# Patient Record
Sex: Male | Born: 2000 | Race: White | Hispanic: No | Marital: Single | Smoking: Never smoker
Health system: Southern US, Community
[De-identification: ages and names within clinical notes are randomized; demographics above are authoritative.]

## PROBLEM LIST (undated history)

## (undated) ENCOUNTER — Ambulatory Visit (HOSPITAL_COMMUNITY): Admission: EM | Payer: Medicaid Other

## (undated) DIAGNOSIS — M25571 Pain in right ankle and joints of right foot: Secondary | ICD-10-CM

## (undated) DIAGNOSIS — F329 Major depressive disorder, single episode, unspecified: Secondary | ICD-10-CM

## (undated) DIAGNOSIS — F32A Depression, unspecified: Secondary | ICD-10-CM

## (undated) HISTORY — PX: CAUTERIZE INNER NOSE: SHX279

---
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
## 2020-01-25 DIAGNOSIS — S92142D Displaced dome fracture of left talus, subsequent encounter for fracture with routine healing: Secondary | ICD-10-CM | POA: Diagnosis not present

## 2020-01-25 DIAGNOSIS — S92124D Nondisplaced fracture of body of right talus, subsequent encounter for fracture with routine healing: Secondary | ICD-10-CM | POA: Diagnosis not present

## 2020-01-25 DIAGNOSIS — S92014D Nondisplaced fracture of body of right calcaneus, subsequent encounter for fracture with routine healing: Secondary | ICD-10-CM | POA: Diagnosis not present

## 2020-02-03 DIAGNOSIS — S99911A Unspecified injury of right ankle, initial encounter: Secondary | ICD-10-CM | POA: Diagnosis not present

## 2020-02-03 DIAGNOSIS — M25571 Pain in right ankle and joints of right foot: Secondary | ICD-10-CM | POA: Diagnosis not present

## 2020-02-12 DIAGNOSIS — S93401D Sprain of unspecified ligament of right ankle, subsequent encounter: Secondary | ICD-10-CM | POA: Diagnosis not present

## 2020-02-12 DIAGNOSIS — M899 Disorder of bone, unspecified: Secondary | ICD-10-CM | POA: Diagnosis not present

## 2020-02-12 DIAGNOSIS — S92124D Nondisplaced fracture of body of right talus, subsequent encounter for fracture with routine healing: Secondary | ICD-10-CM | POA: Diagnosis not present

## 2020-02-12 DIAGNOSIS — S92014D Nondisplaced fracture of body of right calcaneus, subsequent encounter for fracture with routine healing: Secondary | ICD-10-CM | POA: Diagnosis not present

## 2020-02-12 DIAGNOSIS — M949 Disorder of cartilage, unspecified: Secondary | ICD-10-CM | POA: Diagnosis not present

## 2020-02-29 ENCOUNTER — Ambulatory Visit: Payer: Self-pay

## 2020-02-29 ENCOUNTER — Other Ambulatory Visit: Payer: Self-pay

## 2020-02-29 ENCOUNTER — Encounter: Payer: Self-pay | Admitting: Podiatry

## 2020-02-29 ENCOUNTER — Ambulatory Visit (INDEPENDENT_AMBULATORY_CARE_PROVIDER_SITE_OTHER): Payer: Medicaid Other | Admitting: Podiatry

## 2020-02-29 DIAGNOSIS — M899 Disorder of bone, unspecified: Secondary | ICD-10-CM

## 2020-02-29 DIAGNOSIS — S92901A Unspecified fracture of right foot, initial encounter for closed fracture: Secondary | ICD-10-CM

## 2020-02-29 DIAGNOSIS — S92014A Nondisplaced fracture of body of right calcaneus, initial encounter for closed fracture: Secondary | ICD-10-CM

## 2020-02-29 DIAGNOSIS — M25371 Other instability, right ankle: Secondary | ICD-10-CM

## 2020-02-29 DIAGNOSIS — S93401A Sprain of unspecified ligament of right ankle, initial encounter: Secondary | ICD-10-CM
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
## 2020-03-02 DIAGNOSIS — Z419 Encounter for procedure for purposes other than remedying health state, unspecified: Secondary | ICD-10-CM | POA: Diagnosis not present

## 2020-03-08 ENCOUNTER — Other Ambulatory Visit: Payer: Self-pay

## 2020-03-08 DIAGNOSIS — S93401A Sprain of unspecified ligament of right ankle, initial encounter: Secondary | ICD-10-CM

## 2020-03-09 ENCOUNTER — Other Ambulatory Visit: Payer: Self-pay | Admitting: Podiatry

## 2020-03-09 DIAGNOSIS — M25371 Other instability, right ankle: Secondary | ICD-10-CM

## 2020-03-09 DIAGNOSIS — S92014A Nondisplaced fracture of body of right calcaneus, initial encounter for closed fracture: Secondary | ICD-10-CM

## 2020-03-09 DIAGNOSIS — S92901A Unspecified fracture of right foot, initial encounter for closed fracture: Secondary | ICD-10-CM

## 2020-03-09 DIAGNOSIS — M899 Disorder of bone, unspecified: Secondary | ICD-10-CM

## 2020-03-09 DIAGNOSIS — S93401A Sprain of unspecified ligament of right ankle, initial encounter: Secondary | ICD-10-CM

## 2020-03-09 NOTE — Progress Notes (Signed)
Received fax from [facility] that his insurance is out of network, referral sent to [facility] on [address]  Sharl Ma, [place] 03/09/2020

## 2020-03-14 DIAGNOSIS — Z20828 Contact with and (suspected) exposure to other viral communicable diseases: Secondary | ICD-10-CM | POA: Diagnosis not present

## 2020-03-14 DIAGNOSIS — J309 Allergic rhinitis, unspecified: Secondary | ICD-10-CM | POA: Diagnosis not present

## 2020-03-18 ENCOUNTER — Ambulatory Visit: Payer: Medicaid Other | Attending: Podiatry | Admitting: Physical Therapy

## 2020-03-18 ENCOUNTER — Other Ambulatory Visit: Payer: Self-pay

## 2020-03-18 ENCOUNTER — Encounter: Payer: Self-pay | Admitting: Physical Therapy

## 2020-03-18 ENCOUNTER — Ambulatory Visit
Admission: RE | Admit: 2020-03-18 | Discharge: 2020-03-18 | Disposition: A | Discharge status: Home / Self Care | Payer: Medicaid Other | Source: Ambulatory Visit | Attending: Podiatry | Admitting: Podiatry

## 2020-03-18 DIAGNOSIS — M899 Disorder of bone, unspecified: Secondary | ICD-10-CM | POA: Diagnosis not present

## 2020-03-18 DIAGNOSIS — M949 Disorder of cartilage, unspecified: Secondary | ICD-10-CM | POA: Diagnosis not present

## 2020-03-18 DIAGNOSIS — R262 Difficulty in walking, not elsewhere classified: Secondary | ICD-10-CM | POA: Diagnosis not present

## 2020-03-18 DIAGNOSIS — M25471 Effusion, right ankle: Secondary | ICD-10-CM | POA: Diagnosis not present

## 2020-03-18 DIAGNOSIS — M25371 Other instability, right ankle: Secondary | ICD-10-CM | POA: Insufficient documentation

## 2020-03-18 DIAGNOSIS — R600 Localized edema: Secondary | ICD-10-CM | POA: Insufficient documentation

## 2020-03-18 DIAGNOSIS — S93401A Sprain of unspecified ligament of right ankle, initial encounter: Secondary | ICD-10-CM

## 2020-03-18 DIAGNOSIS — S93401D Sprain of unspecified ligament of right ankle, subsequent encounter: Secondary | ICD-10-CM | POA: Diagnosis not present

## 2020-03-18 DIAGNOSIS — M25571 Pain in right ankle and joints of right foot: Secondary | ICD-10-CM | POA: Insufficient documentation

## 2020-03-18 DIAGNOSIS — M19071 Primary osteoarthritis, right ankle and foot: Secondary | ICD-10-CM | POA: Diagnosis not present

## 2020-03-18 DIAGNOSIS — S92901D Unspecified fracture of right foot, subsequent encounter for fracture with routine healing: Secondary | ICD-10-CM | POA: Insufficient documentation

## 2020-03-18 DIAGNOSIS — S92014D Nondisplaced fracture of body of right calcaneus, subsequent encounter for fracture with routine healing: Secondary | ICD-10-CM | POA: Insufficient documentation
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
## 2020-03-30 ENCOUNTER — Other Ambulatory Visit: Payer: Self-pay

## 2020-03-30 ENCOUNTER — Ambulatory Visit: Payer: Medicaid Other | Admitting: Physical Therapy

## 2020-03-30 ENCOUNTER — Encounter: Payer: Self-pay | Admitting: Physical Therapy

## 2020-03-30 DIAGNOSIS — S92014D Nondisplaced fracture of body of right calcaneus, subsequent encounter for fracture with routine healing: Secondary | ICD-10-CM | POA: Diagnosis not present

## 2020-03-30 DIAGNOSIS — M25371 Other instability, right ankle: Secondary | ICD-10-CM

## 2020-03-30 DIAGNOSIS — M899 Disorder of bone, unspecified: Secondary | ICD-10-CM

## 2020-03-30 DIAGNOSIS — M25571 Pain in right ankle and joints of right foot: Secondary | ICD-10-CM

## 2020-03-30 DIAGNOSIS — S92901D Unspecified fracture of right foot, subsequent encounter for fracture with routine healing: Secondary | ICD-10-CM

## 2020-03-30 DIAGNOSIS — S93401D Sprain of unspecified ligament of right ankle, subsequent encounter: Secondary | ICD-10-CM | POA: Diagnosis not present

## 2020-03-30 DIAGNOSIS — R262 Difficulty in walking, not elsewhere classified: Secondary | ICD-10-CM

## 2020-03-30 DIAGNOSIS — R600 Localized edema: Secondary | ICD-10-CM

## 2020-03-30 DIAGNOSIS — M949 Disorder of cartilage, unspecified: Secondary | ICD-10-CM | POA: Diagnosis not present

## 2020-03-30 NOTE — Therapy (Signed)
[facility] Outpatient Rehabilitation [facility] [address] [place], [place], [address] Phone: [phone]   Fax:  [phone]  Physical Therapy Treatment  Patient Details  Name: Christopher Mccann MRN: [id number] Date of Birth: May 24, 2001 Referring Provider (PT): Sharl Ma, DPM   Encounter Date: 03/30/2020   PT End of Session - 03/30/20 0808    Visit Number 2    Number of Visits 12    Date for PT Re-Evaluation 04/29/20    Authorization Type [employer] - pending Berkley Harvey    PT Start Time 0807   pt arrived late   PT Stop Time 0845    PT Time Calculation (min) 38 min    Activity Tolerance Patient tolerated treatment well    Behavior During Therapy [facility] for tasks assessed/performed           Past Medical History:  Diagnosis Date  . Depression     History reviewed. No pertinent surgical history.  There were no vitals filed for this visit.   Subjective Assessment - 03/30/20 0810    Subjective " no changes since the last session. I do the exercises once a day mostly at night. I see the MD tomorrow to discuss the CT results."    Diagnostic tests CT scan 03/19/2020 IMPRESSION:1. No acute osseous injury of the right ankle.2. 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 No ossific intra-articular loose body.3. Small ankle joint effusion. Small subtalar joint effusion.4. Healed nondisplaced fracture of the sustentacular talus. Smallbony fragments along the course of the ATFL likely reflectingsequela prior avulsive injury.    Currently in Pain? Yes    Pain Score 1     Pain Orientation Right              [facility] PT Assessment - 03/30/20 0001      Assessment   Medical Diagnosis Right ankle sprain and talar avulsion fracture, right calcaneal fracture, right talar dome lesion, ankle instability    Referring Provider (PT) Sharl Ma, DPM                          [facility] Adult PT Treatment/Exercise - 03/30/20 0001      Knee/Hip Exercises: Standing   Gait Training heel strike/ toe off forward/ backward in // x 5 ea. Then 4 x 50 ft forward only    verbal cues and demonstration for proper form     Ankle Exercises: Stretches   Slant Board Stretch 30 seconds;4 reps   slant board 2 x gatroc 2 x soleus      Ankle Exercises: Aerobic   Recumbent Bike L3 x 5 min       Ankle Exercises: Seated   BAPS Level 2;Sitting   DF/ PF x20, inversion/ everions x 20, CW/CCW circles 20 ea.   Other Seated Ankle Exercises 4- way ankle strengthening 1 x 15 ea way with green theraband      Ankle Exercises: Standing   Heel Raises 15 reps;Both   x 2 sets with               Balance Exercises - 03/30/20 0001      Balance Exercises: Standing   Standing Eyes Opened Narrow base of support (BOS);Solid surface;1 rep;30 secs    Standing Eyes Closed Narrow base of support (BOS);Solid surface;2 reps;30 secs    Tandem Stance Eyes open;Eyes closed;4 reps;30 secs  PT Education - 03/30/20 0865    Education Details reviewed HEP and discussed importance of consistency with his HEP. updated HEP for theraband ankle 4-way strengthening.    Person(s) Educated Patient    Methods Explanation;Verbal cues    Comprehension Verbalized understanding;Verbal cues required            PT Short Term Goals - 03/18/20 1423      PT SHORT TERM GOAL #1   Title Independent with initial HEP    Baseline needs HEP    Time 3    Period Weeks    Status New    Target Date 04/08/20      PT SHORT TERM GOAL #2   Title Increase right ankle inversion strength to 4+/5 to help improve ankle stability for gait/outdoor ambulation over uneven surfaces    Baseline 4/5    Time 3    Period Weeks    Status New    Target Date 04/08/20             PT Long Term Goals - 03/18/20 1424      PT LONG TERM GOAL #1   Title Tolerate standing and walking for  periods at least 3-4 hours at a time for work shifts with right ankle pain 3/10 or less    Baseline pain up to 8/10    Time 6    Period Weeks    Status New    Target Date 04/29/20      PT LONG TERM GOAL #2   Title Right ankle strength 5/5 to improve ankle stability for gait and outdoor ambulation over uneven surfaces    Baseline see objective-grossly 4 to 4+/5    Time 6    Period Weeks    Status New    Target Date 04/29/20      PT LONG TERM GOAL #3   Title Demonstrate Romberg on Airex eyes closed as well as bilat. tandem stance x 20 sec ea. 1st attempt for improved balance and proprioception for improved safety with ambulation    Baseline see objective, required >1 attempt    Time 6    Period Weeks    Status New    Target Date 04/29/20      PT LONG TERM GOAL #4   Title Increase right ankle PF AROM at least 20 deg to improve gait mechanics for toe off in terminal stance    Baseline 30 deg AROM    Time 6    Period Weeks    Status New    Target Date 04/29/20                 Plan - 03/30/20 0830    Clinical Impression Statement pt reports limited consistency with his HEP due to time constraints. reviewed importance of consistency with his HEP. continued ankle strengtheing and motor contorl which he did well toda. pt sees his MD tomorrow to review CT results. He did well with balance but continues to demo increased postural sway with tandem positiong. practiced gait training which verbal cues and demonstration were required to promote efficency. No changes in pain noted during or following session.    PT Next Visit Plan Review HEP as needed, pending pain and tolerance progress open and closed chain ankle strengthening/stabilization, balance and propriopceptive challenges, gait training.    PT Home Exercise Plan Access code: [email or web address] ankle DF and PF, ankle IV and EV isometrics, Romberg and tandem stance, ankle 4-way theraband strengthening.  Consulted and Agree with  Plan of Care Patient           Patient will benefit from skilled therapeutic intervention in order to improve the following deficits and impairments:  Pain, Decreased strength, Decreased activity tolerance, Increased edema, Decreased range of motion, Decreased balance, Difficulty walking, Hypomobility  Visit Diagnosis: Sprain of ligament of right ankle, subsequent encounter  Unstable right ankle  Closed nondisplaced fracture of body of right calcaneus with routine healing, subsequent encounter  Bone disease  Disorder of bone and cartilage  Closed fracture of right foot with routine healing, subsequent encounter  Pain in right ankle and joints of right foot  Localized edema  Difficulty in walking, not elsewhere classified     Problem List There are no problems to display for this patient.  Lulu Riding PT, DPT, LAT, ATC  03/30/20  8:47 AM      [facility] [address] [place], [place], [address] Phone: [phone]   Fax:  [phone]  Name: Christopher Mccann MRN: [id number] Date of Birth: [birth year]

## 2020-03-31 ENCOUNTER — Ambulatory Visit (INDEPENDENT_AMBULATORY_CARE_PROVIDER_SITE_OTHER): Payer: Medicaid Other | Admitting: Podiatry

## 2020-03-31 DIAGNOSIS — M216X1 Other acquired deformities of right foot: Secondary | ICD-10-CM | POA: Diagnosis not present

## 2020-03-31 DIAGNOSIS — M899 Disorder of bone, unspecified: Secondary | ICD-10-CM | POA: Diagnosis not present

## 2020-03-31 DIAGNOSIS — M949 Disorder of cartilage, unspecified: Secondary | ICD-10-CM | POA: Diagnosis not present

## 2020-03-31 NOTE — Patient Instructions (Signed)
Osteochondral Fracture  An osteochondral fracture is a break or tear (rupture) in the protective tissue that cushions bones in the joints (articular cartilage). Osteochondral fractures most commonly happen in the knees or ankles, but they can also happen in other parts of the body. What are the causes? This condition is caused by a forceful hit to your joint. It can be caused by one event or from repetitive damage to your joint over time. What increases the risk? You are more likely to develop this condition if you are:  A younger person. Children and adolescents are at greater risk of this condition.  An athlete. What are the signs or symptoms? Symptoms of this condition include:  Swelling.  Pain.  A crackling sound (crepitation) when you move the joint.  Difficulty moving the joint, or feeling like the joint catches or locks when you try to move it.  Instability of the joint. How is this diagnosed? This condition is diagnosed based on:  Your complete medical history.  A physical exam.  Imaging tests. These may include X-rays, a CT scan, or an MRI. How is this treated? This condition may be treated by:  Icing the injured area and taking medicines. These help with pain and inflammation.  Using crutches. These help with walking. They may be needed if the injured joint is in one of the legs.  Wearing a cast or splint. This keeps the cartilage and bones in place so they can heal.  Having surgery. This may be done if other treatments do not work. During the surgery, any bones that have broken off will either be put into place or be removed if they cannot be reattached.  Doing joint and muscle exercises. These help to strengthen and stretch the affected joints and muscles. They may be done at home or with a physical therapist. If treated properly, symptoms go away in most cases. Follow these instructions at home: Medicines  Take over-the-counter and prescription medicines as  told by your health care provider.  Ask your health care provider if the medicine prescribed to you: ? Requires you to avoid driving or using heavy machinery. ? Can cause constipation. You may need to take actions to prevent or treat constipation, such as:  Drink enough fluid to keep your urine pale yellow.  Take over-the-counter or prescription medicines.  Eat foods that are high in fiber, such as beans, whole grains, and fresh fruits and vegetables.  Limit foods that are high in fat and processed sugars, such as fried or sweet foods. If you have a cast:  Do not stick anything inside the cast to scratch your skin. Doing that increases your risk of infection.  You may put lotion on dry skin around the edges of the cast. Do not put lotion on the skin underneath the cast.  Check the skin around the cast every day. Tell your health care provider about any concerns.  Keep the cast clean and dry. If you have a splint:  Wear the splint as told by your health care provider. Remove it only as told by your health care provider.  Loosen the splint if your fingers or toes tingle, become numb, or turn cold and blue.  Keep the splint clean and dry. Bathing  Do not take baths, swim, or use a hot tub until your health care provider approves. Ask your health care provider if you may take showers. You may only be allowed to take sponge baths.  If your cast or splint  is not waterproof: ? Do not let it get wet. ? Cover it with a watertight covering when you take a bath or shower. Managing pain, stiffness, and swelling   If directed, put ice on the injured area. ? If you have a removable splint, remove it as told by your health care provider. ? Put ice in a plastic bag. ? Place a towel between your skin and the bag or between your cast and the bag. ? Leave the ice on for 20 minutes, 2-3 times a day.  Move your fingers or toes often to reduce stiffness and swelling.  Raise (elevate) the  injured area above the level of your heart while you are sitting or lying down. Activity  Do not lift anything that is heavier than 10 lb (4.5 kg), or the limit that you are told, until your health care provider says that it is safe.  Perform exercises daily as told by your health care provider or physical therapist.  Return to your normal activities as told by your health care provider. Ask your health care provider what activities are safe for you. General instructions  Do not put pressure on any part of the cast or splint until it is fully hardened. This may take several hours.  Ask your health care provider when it is safe to drive if you have a cast or splint on your arm or leg.  Do not use any products that contain nicotine or tobacco, such as cigarettes, e-cigarettes, and chewing tobacco. These can delay healing. If you need help quitting, ask your health care provider.  Keep all follow-up visits as told by your health care provider. This is important. Contact a health care provider if:  You have symptoms that get worse or do not improve after 2 weeks of treatment. Get help right away if:  You have severe pain. Summary  An osteochondral fracture is a break or tear in the protective tissue that cushions bones in the joints.  This condition is caused by a forceful hit to your joint. It can be caused by one event or from repetitive damage to your joint over time.  These fractures most commonly happen in the knees or ankles, but they can also happen in other parts of the body.  This injury is treated with rest, ice, medicines, and surgery if needed.  Contact a health care provider if your symptoms get worse or do not improve after 2 weeks of treatment. This information is not intended to replace advice given to you by your health care provider. Make sure you discuss any questions you have with your health care provider. Document Revised: 10/09/2018 Document Reviewed:  05/26/2018 Elsevier Patient Education  2020 [employer]  
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
## 2020-04-01 ENCOUNTER — Ambulatory Visit: Payer: Medicaid Other | Admitting: Physical Therapy

## 2020-04-04 ENCOUNTER — Ambulatory Visit: Payer: Medicaid Other | Admitting: Physical Therapy

## 2020-04-06 ENCOUNTER — Other Ambulatory Visit: Payer: Self-pay

## 2020-04-06 ENCOUNTER — Encounter: Payer: Self-pay | Admitting: Physical Therapy

## 2020-04-06 ENCOUNTER — Ambulatory Visit: Payer: Medicaid Other | Attending: Podiatry | Admitting: Physical Therapy

## 2020-04-06 DIAGNOSIS — M899 Disorder of bone, unspecified: Secondary | ICD-10-CM | POA: Diagnosis not present

## 2020-04-06 DIAGNOSIS — S92901D Unspecified fracture of right foot, subsequent encounter for fracture with routine healing: Secondary | ICD-10-CM | POA: Insufficient documentation

## 2020-04-06 DIAGNOSIS — M25371 Other instability, right ankle: Secondary | ICD-10-CM | POA: Insufficient documentation

## 2020-04-06 DIAGNOSIS — M25571 Pain in right ankle and joints of right foot: Secondary | ICD-10-CM | POA: Insufficient documentation

## 2020-04-06 DIAGNOSIS — R262 Difficulty in walking, not elsewhere classified: Secondary | ICD-10-CM | POA: Diagnosis not present

## 2020-04-06 DIAGNOSIS — S93401D Sprain of unspecified ligament of right ankle, subsequent encounter: Secondary | ICD-10-CM | POA: Insufficient documentation

## 2020-04-06 DIAGNOSIS — R600 Localized edema: Secondary | ICD-10-CM | POA: Insufficient documentation

## 2020-04-06 DIAGNOSIS — M949 Disorder of cartilage, unspecified: Secondary | ICD-10-CM | POA: Insufficient documentation

## 2020-04-06 DIAGNOSIS — S92014D Nondisplaced fracture of body of right calcaneus, subsequent encounter for fracture with routine healing: Secondary | ICD-10-CM | POA: Diagnosis not present
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
## 2020-04-11 ENCOUNTER — Ambulatory Visit: Payer: Medicaid Other | Admitting: Physical Therapy

## 2020-04-11 ENCOUNTER — Other Ambulatory Visit: Payer: Self-pay

## 2020-04-11 ENCOUNTER — Encounter: Payer: Self-pay | Admitting: Physical Therapy

## 2020-04-11 DIAGNOSIS — M25571 Pain in right ankle and joints of right foot: Secondary | ICD-10-CM | POA: Diagnosis not present

## 2020-04-11 DIAGNOSIS — S92014D Nondisplaced fracture of body of right calcaneus, subsequent encounter for fracture with routine healing: Secondary | ICD-10-CM

## 2020-04-11 DIAGNOSIS — M25371 Other instability, right ankle: Secondary | ICD-10-CM | POA: Diagnosis not present

## 2020-04-11 DIAGNOSIS — S93401D Sprain of unspecified ligament of right ankle, subsequent encounter: Secondary | ICD-10-CM | POA: Diagnosis not present

## 2020-04-11 DIAGNOSIS — S92901D Unspecified fracture of right foot, subsequent encounter for fracture with routine healing: Secondary | ICD-10-CM

## 2020-04-11 DIAGNOSIS — M899 Disorder of bone, unspecified: Secondary | ICD-10-CM

## 2020-04-11 DIAGNOSIS — R262 Difficulty in walking, not elsewhere classified: Secondary | ICD-10-CM | POA: Diagnosis not present

## 2020-04-11 DIAGNOSIS — R600 Localized edema: Secondary | ICD-10-CM | POA: Diagnosis not present

## 2020-04-11 DIAGNOSIS — M949 Disorder of cartilage, unspecified: Secondary | ICD-10-CM | POA: Diagnosis not present

## 2020-04-11 NOTE — Therapy (Signed)
[facility] [address] [place], [place], [address] Phone: [phone]   Fax:  [phone]  Physical Therapy Treatment    Patient Details  Name: Christopher Mccann MRN: [id number] Date of Birth: 2000-09-10 Referring Provider (PT): Sharl Ma, DPM   Encounter Date: 04/11/2020   PT End of Session - 04/11/20 0848    Visit Number 4    Number of Visits 12    Date for PT Re-Evaluation 04/29/20    Authorization Type [employer] - pending auth    Authorization Time Period 03/30/2020 - 05/10/2020    Progress Note Due on Visit 12    PT Start Time 0849    PT Stop Time 0929    PT Time Calculation (min) 40 min    Activity Tolerance Patient tolerated treatment well    Behavior During Therapy [facility] for tasks assessed/performed           Past Medical History:  Diagnosis Date  . Depression     History reviewed. No pertinent surgical history.  There were no vitals filed for this visit.   Subjective Assessment - 04/11/20 0849    Subjective "I feel like I am getting better since I started here with PT."    Diagnostic tests CT scan 03/19/2020 IMPRESSION:1. No acute osseous injury of the right ankle.2. 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 No ossific intra-articular loose body.3. Small ankle joint effusion. Small subtalar joint effusion.4. Healed nondisplaced fracture of the sustentacular talus. Smallbony fragments along the course of the ATFL likely reflectingsequela prior avulsive injury.    Currently in Pain? Yes    Pain Score 2     Pain Orientation Right    Pain Descriptors / Indicators Aching;Sore    Pain Type Chronic pain    Pain Onset More than a month ago    Pain Frequency Intermittent    Aggravating Factors  just being on the ankle all day    Pain Relieving Factors rubbing the ankle              [facility] PT Assessment  - 04/11/20 0001      Assessment   Medical Diagnosis Right ankle sprain and talar avulsion fracture, right calcaneal fracture, right talar dome lesion, ankle instability    Referring Provider (PT) Sharl Ma, DPM                         [facility] Adult PT Treatment/Exercise - 04/11/20 0001      Knee/Hip Exercises: Standing   Step Down 2 sets;10 reps;Left;Step Height: 4"    Wall Squat 2 sets;10 reps      Manual Therapy   Manual Therapy Joint mobilization;Soft tissue mobilization    Manual therapy comments arch support tape R ankle    Joint Mobilization talocrural PA/AP grade IV, and talocrural distaction grad IV    Soft tissue mobilization MTPR for the tibialis posterior, and peroneal longs/ brevis      Ankle Exercises: Seated   BAPS Sitting;Level 3   DF/PF, inversion/ everions and CW/CCW 1 x 20 ea.   Other Seated Ankle Exercises 4- way ankle strengthening 1 x 20 ea way with Blue theraband      Ankle Exercises: Aerobic   Recumbent Bike L5 x 5 min       Ankle Exercises: Stretches   Slant Board Stretch 2 reps;30 seconds  gastroc/ soleus              Balance Exercises - 04/11/20 0001      Balance Exercises: Standing   Heel Raises Both;15 reps   on airex pad              PT Short Term Goals - 03/18/20 1423      PT SHORT TERM GOAL #1   Title Independent with initial HEP    Baseline needs HEP    Time 3    Period Weeks    Status New    Target Date 04/08/20      PT SHORT TERM GOAL #2   Title Increase right ankle inversion strength to 4+/5 to help improve ankle stability for gait/outdoor ambulation over uneven surfaces    Baseline 4/5    Time 3    Period Weeks    Status New    Target Date 04/08/20             PT Long Term Goals - 03/18/20 1424      PT LONG TERM GOAL #1   Title Tolerate standing and walking for periods at least 3-4 hours at a time for work shifts with right ankle pain 3/10 or less    Baseline pain up to 8/10    Time 6     Period Weeks    Status New    Target Date 04/29/20      PT LONG TERM GOAL #2   Title Right ankle strength 5/5 to improve ankle stability for gait and outdoor ambulation over uneven surfaces    Baseline see objective-grossly 4 to 4+/5    Time 6    Period Weeks    Status New    Target Date 04/29/20      PT LONG TERM GOAL #3   Title Demonstrate Romberg on Airex eyes closed as well as bilat. tandem stance x 20 sec ea. 1st attempt for improved balance and proprioception for improved safety with ambulation    Baseline see objective, required >1 attempt    Time 6    Period Weeks    Status New    Target Date 04/29/20      PT LONG TERM GOAL #4   Title Increase right ankle PF AROM at least 20 deg to improve gait mechanics for toe off in terminal stance    Baseline 30 deg AROM    Time 6    Period Weeks    Status New    Target Date 04/29/20                 Plan - 04/11/20 0916    Clinical Impression Statement pt reports improvement since starting PT and is motivated to continued with treatment and strengthenging. trialed arch support taping to promote arch positioning and conitnued ankle motor contorl and strengthening. He did well with CKC strengthening with wall squats and step up/ down noting soreness end of session rated at s 2-3/10.    PT Treatment/Interventions ADLs/Self Care Home Management;Cryotherapy;Electrical Stimulation;Moist Heat;Iontophoresis 4mg /ml Dexamethasone;Therapeutic activities;Functional mobility training;Gait training;Therapeutic exercise;Patient/family education;Manual techniques;Neuromuscular re-education;Balance training;Taping;Vasopneumatic Device    PT Next Visit Plan continue to work on strengthening and balance until he has his second opinion, update HEP for CKC strengthening, how was arch taping.    PT Home Exercise Plan Access code: [email or web address]-Theraband ankle DF and PF, ankle IV and EV isometrics, Romberg and tandem stance, ankle 4-way theraband  strengthening.  Patient will benefit from skilled therapeutic intervention in order to improve the following deficits and impairments:  Pain, Decreased strength, Decreased activity tolerance, Increased edema, Decreased range of motion, Decreased balance, Difficulty walking, Hypomobility  Visit Diagnosis: Sprain of ligament of right ankle, subsequent encounter  Unstable right ankle  Closed nondisplaced fracture of body of right calcaneus with routine healing, subsequent encounter  Bone disease  Disorder of bone and cartilage  Closed fracture of right foot with routine healing, subsequent encounter  Pain in right ankle and joints of right foot  Localized edema  Difficulty in walking, not elsewhere classified     Problem List There are no problems to display for this patient.  Lulu Riding PT, DPT, LAT, ATC  04/11/20  9:30 AM      [facility] [address] [place], [place], [address] Phone: [phone]   Fax:  [phone]  Name: Gaven Eugene MRN: [id number] Date of Birth: 30-Aug-2000

## 2020-04-13 ENCOUNTER — Other Ambulatory Visit: Payer: Self-pay

## 2020-04-13 ENCOUNTER — Encounter: Payer: Self-pay | Admitting: Physical Therapy

## 2020-04-13 ENCOUNTER — Ambulatory Visit: Payer: Medicaid Other | Admitting: Physical Therapy

## 2020-04-13 DIAGNOSIS — M25571 Pain in right ankle and joints of right foot: Secondary | ICD-10-CM

## 2020-04-13 DIAGNOSIS — M949 Disorder of cartilage, unspecified: Secondary | ICD-10-CM

## 2020-04-13 DIAGNOSIS — S93401D Sprain of unspecified ligament of right ankle, subsequent encounter: Secondary | ICD-10-CM | POA: Diagnosis not present

## 2020-04-13 DIAGNOSIS — R262 Difficulty in walking, not elsewhere classified: Secondary | ICD-10-CM | POA: Diagnosis not present

## 2020-04-13 DIAGNOSIS — M25371 Other instability, right ankle: Secondary | ICD-10-CM | POA: Diagnosis not present

## 2020-04-13 DIAGNOSIS — M899 Disorder of bone, unspecified: Secondary | ICD-10-CM | POA: Diagnosis not present

## 2020-04-13 DIAGNOSIS — R600 Localized edema: Secondary | ICD-10-CM

## 2020-04-13 DIAGNOSIS — S92014D Nondisplaced fracture of body of right calcaneus, subsequent encounter for fracture with routine healing: Secondary | ICD-10-CM

## 2020-04-13 DIAGNOSIS — S92901D Unspecified fracture of right foot, subsequent encounter for fracture with routine healing: Secondary | ICD-10-CM

## 2020-04-13 NOTE — Therapy (Signed)
[facility] Outpatient Rehabilitation [facility] [address] [place], [place], [address] Phone: [phone]   Fax:  [phone]  Physical Therapy Treatment  Patient Details  Name: Christopher Mccann MRN: [id number] Date of Birth: December 15, 2000 Referring Provider (PT): Sharl Ma, DPM   Encounter Date: 04/13/2020   PT End of Session - 04/13/20 0813    Visit Number 5    Number of Visits 12    Date for PT Re-Evaluation 04/29/20    Authorization Type [employer] - pending auth    Authorization Time Period 03/30/2020 - 05/10/2020    Authorization - Visit Number 3    Authorization - Number of Visits 12    PT Start Time [phone]   pt arrived late today   PT Stop Time 0844    PT Time Calculation (min) 30 min    Activity Tolerance Patient tolerated treatment well    Behavior During Therapy [facility] for tasks assessed/performed           Past Medical History:  Diagnosis Date  . Depression     History reviewed. No pertinent surgical history.  There were no vitals filed for this visit.   Subjective Assessment - 04/13/20 0815    Subjective "I am unsure if the tape helped or not, I did feel alittle more sore after the last session. today I am feeling about 3/10 pain in the ankle."    Diagnostic tests CT scan 03/19/2020 IMPRESSION:1. No acute osseous injury of the right ankle.2. 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 No ossific intra-articular loose body.3. Small ankle joint effusion. Small subtalar joint effusion.4. Healed nondisplaced fracture of the sustentacular talus. Smallbony fragments along the course of the ATFL likely reflectingsequela prior avulsive injury.    Patient Stated Goals Get ankle pain    Currently in Pain? Yes    Pain Score 3     Pain Location Ankle    Pain Orientation Right              [facility] PT Assessment - 04/13/20 0001      Assessment    Medical Diagnosis Right ankle sprain and talar avulsion fracture, right calcaneal fracture, right talar dome lesion, ankle instability    Referring Provider (PT) Sharl Ma, DPM                         [facility] Adult PT Treatment/Exercise - 04/13/20 0001      Knee/Hip Exercises: Standing   Hip Abduction Stengthening;Both;2 sets;15 reps;Knee straight   with red band   Hip Extension Stengthening;Both;2 sets;Knee straight;15 reps   with red band   Step Down 2 sets;10 reps;Left;Step Height: 4"    Gait Training heel strike/ toe off forward 6 x 50 ft   demnstration and verbal cues for proper form     Ankle Exercises: Aerobic   Recumbent Bike L5 x 5 min      Ankle Exercises: Stretches   Slant Board Stretch 2 reps;30 seconds      Ankle Exercises: Standing   Heel Raises 20 reps;Both   from slant board              Balance Exercises - 04/13/20 0001      Balance Exercises: Standing   Rebounder Double leg;Foam/compliant surface;20 reps   x 2 sets rhomberg pos, 2 x modified tandem with red ball  PT Short Term Goals - 03/18/20 1423      PT SHORT TERM GOAL #1   Title Independent with initial HEP    Baseline needs HEP    Time 3    Period Weeks    Status New    Target Date 04/08/20      PT SHORT TERM GOAL #2   Title Increase right ankle inversion strength to 4+/5 to help improve ankle stability for gait/outdoor ambulation over uneven surfaces    Baseline 4/5    Time 3    Period Weeks    Status New    Target Date 04/08/20             PT Long Term Goals - 03/18/20 1424      PT LONG TERM GOAL #1   Title Tolerate standing and walking for periods at least 3-4 hours at a time for work shifts with right ankle pain 3/10 or less    Baseline pain up to 8/10    Time 6    Period Weeks    Status New    Target Date 04/29/20      PT LONG TERM GOAL #2   Title Right ankle strength 5/5 to improve ankle stability for gait and outdoor ambulation over  uneven surfaces    Baseline see objective-grossly 4 to 4+/5    Time 6    Period Weeks    Status New    Target Date 04/29/20      PT LONG TERM GOAL #3   Title Demonstrate Romberg on Airex eyes closed as well as bilat. tandem stance x 20 sec ea. 1st attempt for improved balance and proprioception for improved safety with ambulation    Baseline see objective, required >1 attempt    Time 6    Period Weeks    Status New    Target Date 04/29/20      PT LONG TERM GOAL #4   Title Increase right ankle PF AROM at least 20 deg to improve gait mechanics for toe off in terminal stance    Baseline 30 deg AROM    Time 6    Period Weeks    Status New    Target Date 04/29/20                 Plan - 04/13/20 0817    Clinical Impression Statement pt arrived to today's session noting mild inrease in pain which he is unsure if it is related to the arch taping or new exercises. limited session due to pt arriving late so focusing on functional strengthening and balance training.    PT Treatment/Interventions ADLs/Self Care Home Management;Cryotherapy;Electrical Stimulation;Moist Heat;Iontophoresis 4mg /ml Dexamethasone;Therapeutic activities;Functional mobility training;Gait training;Therapeutic exercise;Patient/family education;Manual techniques;Neuromuscular re-education;Balance training;Taping;Vasopneumatic Device    PT Next Visit Plan continue to work on strengthening and balance until he has his second opinion, update HEP for CKC strengthening, hip/ knee strengthening.    PT Home Exercise Plan Access code: [email or web address] ankle DF and PF, ankle IV and EV isometrics, Romberg and tandem stance, ankle 4-way theraband strengthening.    Consulted and Agree with Plan of Care Patient           Patient will benefit from skilled therapeutic intervention in order to improve the following deficits and impairments:  Pain, Decreased strength, Decreased activity tolerance, Increased edema, Decreased  range of motion, Decreased balance, Difficulty walking, Hypomobility  Visit Diagnosis: Sprain of ligament of right ankle, subsequent encounter  Unstable right ankle  Closed  nondisplaced fracture of body of right calcaneus with routine healing, subsequent encounter  Bone disease  Disorder of bone and cartilage  Closed fracture of right foot with routine healing, subsequent encounter  Pain in right ankle and joints of right foot  Localized edema  Difficulty in walking, not elsewhere classified     Problem List There are no problems to display for this patient.  Lulu Riding PT, DPT, LAT, ATC  04/13/20  8:47 AM      [facility] [address] [place], [place], [address] Phone: [phone]   Fax:  [phone]  Name: Christopher Mccann MRN: [id number] Date of Birth: 11-Jul-2000

## 2020-04-18 ENCOUNTER — Encounter: Payer: Self-pay | Admitting: Physical Therapy

## 2020-04-18 ENCOUNTER — Ambulatory Visit: Payer: Medicaid Other | Admitting: Physical Therapy

## 2020-04-18 ENCOUNTER — Other Ambulatory Visit: Payer: Self-pay

## 2020-04-18 DIAGNOSIS — M949 Disorder of cartilage, unspecified: Secondary | ICD-10-CM

## 2020-04-18 DIAGNOSIS — R600 Localized edema: Secondary | ICD-10-CM | POA: Diagnosis not present

## 2020-04-18 DIAGNOSIS — S92014D Nondisplaced fracture of body of right calcaneus, subsequent encounter for fracture with routine healing: Secondary | ICD-10-CM

## 2020-04-18 DIAGNOSIS — S93401D Sprain of unspecified ligament of right ankle, subsequent encounter: Secondary | ICD-10-CM | POA: Diagnosis not present

## 2020-04-18 DIAGNOSIS — S92901D Unspecified fracture of right foot, subsequent encounter for fracture with routine healing: Secondary | ICD-10-CM

## 2020-04-18 DIAGNOSIS — R262 Difficulty in walking, not elsewhere classified: Secondary | ICD-10-CM

## 2020-04-18 DIAGNOSIS — M899 Disorder of bone, unspecified: Secondary | ICD-10-CM

## 2020-04-18 DIAGNOSIS — M25571 Pain in right ankle and joints of right foot: Secondary | ICD-10-CM | POA: Diagnosis not present

## 2020-04-18 DIAGNOSIS — M25371 Other instability, right ankle: Secondary | ICD-10-CM

## 2020-04-18 NOTE — Therapy (Signed)
[facility] Outpatient Rehabilitation [facility] [address] [place], [place], [address] Phone: [phone]   Fax:  [phone]  Physical Therapy Treatment  Patient Details  Name: Christopher Mccann MRN: [id number] Date of Birth: Nov 10, 2000 Referring Provider (PT): Sharl Ma, DPM   Encounter Date: 04/18/2020   PT End of Session - 04/18/20 0807    Visit Number 6    Number of Visits 12    Date for PT Re-Evaluation 04/29/20    Authorization Type [employer] - pending auth    Authorization Time Period 03/30/2020 - 05/10/2020    Authorization - Visit Number 4    Authorization - Number of Visits 12    PT Start Time 0808   pt arrived late today   PT Stop Time 0846    PT Time Calculation (min) 38 min           Past Medical History:  Diagnosis Date   Depression     History reviewed. No pertinent surgical history.  There were no vitals filed for this visit.   Subjective Assessment - 04/18/20 0809    Subjective "no issues today, I am still doing those exercise at home and I think it is helping."    Currently in Pain? No/denies    Aggravating Factors  unsure of specific activity              [facility] PT Assessment - 04/18/20 0001      Assessment   Medical Diagnosis Right ankle sprain and talar avulsion fracture, right calcaneal fracture, right talar dome lesion, ankle instability    Referring Provider (PT) Sharl Ma, DPM                         [facility] Adult PT Treatment/Exercise - 04/18/20 0001      Knee/Hip Exercises: Machines for Strengthening   Cybex Knee Extension 2 x 15 20#    Cybex Knee Flexion 2 x 15 25#      Knee/Hip Exercises: Standing   Forward Step Up 2 sets;15 reps   onto BOSU for dynamic balance training.     Ankle Exercises: Aerobic   Elliptical L1 x 5 min ramp L1      Ankle Exercises: Standing   Heel Raises 20 reps;Other (comment)   con bil, eccentric RLE only     Ankle Exercises: Stretches   Slant Board Stretch 4  reps;30 seconds   2 x gastroc / 2 x soleus              Balance Exercises - 04/18/20 0001      Balance Exercises: Standing   Tandem Stance Eyes open;2 reps   switching lead leg abc with black physioball   SLS with Vectors Solid surface   ABC with black phsioball requiring L LE support   Rebounder Single leg;Static;10 reps   3 x RLE requiring intermittent contralateral support   Marching --               PT Short Term Goals - 03/18/20 1423      PT SHORT TERM GOAL #1   Title Independent with initial HEP    Baseline needs HEP    Time 3    Period Weeks    Status New    Target Date 04/08/20      PT SHORT TERM GOAL #2   Title Increase right ankle inversion strength to 4+/5 to help improve ankle stability for gait/outdoor ambulation over uneven surfaces  Baseline 4/5    Time 3    Period Weeks    Status New    Target Date 04/08/20             PT Long Term Goals - 03/18/20 1424      PT LONG TERM GOAL #1   Title Tolerate standing and walking for periods at least 3-4 hours at a time for work shifts with right ankle pain 3/10 or less    Baseline pain up to 8/10    Time 6    Period Weeks    Status New    Target Date 04/29/20      PT LONG TERM GOAL #2   Title Right ankle strength 5/5 to improve ankle stability for gait and outdoor ambulation over uneven surfaces    Baseline see objective-grossly 4 to 4+/5    Time 6    Period Weeks    Status New    Target Date 04/29/20      PT LONG TERM GOAL #3   Title Demonstrate Romberg on Airex eyes closed as well as bilat. tandem stance x 20 sec ea. 1st attempt for improved balance and proprioception for improved safety with ambulation    Baseline see objective, required >1 attempt    Time 6    Period Weeks    Status New    Target Date 04/29/20      PT LONG TERM GOAL #4   Title Increase right ankle PF AROM at least 20 deg to improve gait mechanics for toe off in terminal stance    Baseline 30 deg AROM    Time 6     Period Weeks    Status New    Target Date 04/29/20                 Plan - 04/18/20 0845    Clinical Impression Statement Pt reports no pain today and continues to note benefit of strengthening with improvement in pain when doing his HEP at home. Continued working on hip/ knee strengthening to work up the kinetic chain and ankle strengthening as well as balance training which he demonstrated significant limitation with R SLS. pt reported no pain end of session.    PT Treatment/Interventions ADLs/Self Care Home Management;Cryotherapy;Electrical Stimulation;Moist Heat;Iontophoresis 4mg /ml Dexamethasone;Therapeutic activities;Functional mobility training;Gait training;Therapeutic exercise;Patient/family education;Manual techniques;Neuromuscular re-education;Balance training;Taping;Vasopneumatic Device    PT Next Visit Plan continue to work on strengthening and balance until he has his second opinion, update HEP for CKC strengthening, hip/ knee strengthening.    PT Home Exercise Plan Access code: [email or web address] ankle DF and PF, ankle IV and EV isometrics, Romberg and tandem stance, ankle 4-way theraband strengthening.    Consulted and Agree with Plan of Care Patient           Patient will benefit from skilled therapeutic intervention in order to improve the following deficits and impairments:  Pain, Decreased strength, Decreased activity tolerance, Increased edema, Decreased range of motion, Decreased balance, Difficulty walking, Hypomobility  Visit Diagnosis: Sprain of ligament of right ankle, subsequent encounter  Unstable right ankle  Closed nondisplaced fracture of body of right calcaneus with routine healing, subsequent encounter  Bone disease  Disorder of bone and cartilage  Difficulty in walking, not elsewhere classified  Pain in right ankle and joints of right foot  Localized edema  Closed fracture of right foot with routine healing, subsequent  encounter     Problem List There are no problems to display for this patient.  Lulu Riding PT, DPT, LAT, ATC  04/18/20  8:51 AM      [facility] [address] [place], [place], [address] Phone: [phone]   Fax:  [phone]  Name: Christopher Mccann MRN: [id number] Date of Birth: [birth year]

## 2020-04-20 ENCOUNTER — Ambulatory Visit (INDEPENDENT_AMBULATORY_CARE_PROVIDER_SITE_OTHER): Payer: Medicaid Other | Admitting: Podiatry

## 2020-04-20 ENCOUNTER — Encounter: Payer: Self-pay | Admitting: Podiatry

## 2020-04-20 ENCOUNTER — Other Ambulatory Visit: Payer: Self-pay

## 2020-04-20 ENCOUNTER — Ambulatory Visit: Payer: Medicaid Other | Admitting: Physical Therapy

## 2020-04-20 DIAGNOSIS — M899 Disorder of bone, unspecified: Secondary | ICD-10-CM | POA: Diagnosis not present

## 2020-04-20 DIAGNOSIS — S92014D Nondisplaced fracture of body of right calcaneus, subsequent encounter for fracture with routine healing: Secondary | ICD-10-CM | POA: Diagnosis not present

## 2020-04-20 DIAGNOSIS — R262 Difficulty in walking, not elsewhere classified: Secondary | ICD-10-CM

## 2020-04-20 DIAGNOSIS — M25571 Pain in right ankle and joints of right foot: Secondary | ICD-10-CM

## 2020-04-20 DIAGNOSIS — M25371 Other instability, right ankle: Secondary | ICD-10-CM

## 2020-04-20 DIAGNOSIS — S92901D Unspecified fracture of right foot, subsequent encounter for fracture with routine healing: Secondary | ICD-10-CM

## 2020-04-20 DIAGNOSIS — S93401D Sprain of unspecified ligament of right ankle, subsequent encounter: Secondary | ICD-10-CM

## 2020-04-20 DIAGNOSIS — S93401A Sprain of unspecified ligament of right ankle, initial encounter: Secondary | ICD-10-CM | POA: Diagnosis not present

## 2020-04-20 DIAGNOSIS — R600 Localized edema: Secondary | ICD-10-CM

## 2020-04-20 DIAGNOSIS — M949 Disorder of cartilage, unspecified: Secondary | ICD-10-CM
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
## 2020-04-25 ENCOUNTER — Ambulatory Visit: Payer: Medicaid Other | Admitting: Physical Therapy

## 2020-04-25 ENCOUNTER — Other Ambulatory Visit: Payer: Self-pay

## 2020-04-25 DIAGNOSIS — R600 Localized edema: Secondary | ICD-10-CM | POA: Diagnosis not present

## 2020-04-25 DIAGNOSIS — M25571 Pain in right ankle and joints of right foot: Secondary | ICD-10-CM

## 2020-04-25 DIAGNOSIS — M949 Disorder of cartilage, unspecified: Secondary | ICD-10-CM | POA: Diagnosis not present

## 2020-04-25 DIAGNOSIS — S92014D Nondisplaced fracture of body of right calcaneus, subsequent encounter for fracture with routine healing: Secondary | ICD-10-CM | POA: Diagnosis not present

## 2020-04-25 DIAGNOSIS — M899 Disorder of bone, unspecified: Secondary | ICD-10-CM | POA: Diagnosis not present

## 2020-04-25 DIAGNOSIS — S93401D Sprain of unspecified ligament of right ankle, subsequent encounter: Secondary | ICD-10-CM

## 2020-04-25 DIAGNOSIS — S92901D Unspecified fracture of right foot, subsequent encounter for fracture with routine healing: Secondary | ICD-10-CM | POA: Diagnosis not present

## 2020-04-25 DIAGNOSIS — M25371 Other instability, right ankle: Secondary | ICD-10-CM

## 2020-04-25 DIAGNOSIS — R262 Difficulty in walking, not elsewhere classified: Secondary | ICD-10-CM | POA: Diagnosis not present

## 2020-04-25 NOTE — Therapy (Addendum)
[place], [place], [address] Phone: [phone]   Fax:  [phone]  Physical Therapy Treatment / Discharge  Patient Details  Name: Christopher Mccann MRN: [id number] Date of Birth: [birth year] Referring Provider (PT): Lanae Crumbly, DPM   Encounter Date: 04/25/2020   PT End of Session - 04/25/20 0819    Visit Number 8    Number of Visits 12    Date for PT Re-Evaluation 04/29/20    PT Start Time 0807    PT Stop Time 0845    PT Time Calculation (min) 38 min    Activity Tolerance Patient tolerated treatment well    Behavior During Therapy [facility] for tasks assessed/performed           Past Medical History:  Diagnosis Date  . Depression     No past surgical history on file.  There were no vitals filed for this visit.   Subjective Assessment - 04/25/20 0841    Subjective little sore after last visit    Currently in Pain? Yes    Pain Score 3     Pain Location Ankle                             [facility] Adult PT Treatment/Exercise - 04/25/20 0001      Manual Therapy   Manual Therapy Other (comment)   MWM into DF with green theraband, right foot on mat, 2x10   Joint Mobilization talocrural PA/AP grade III, talocrual distraction grade III, talocural inversion ML grade III      Ankle Exercises: Stretches   Soleus Stretch 2 reps;30 seconds    Gastroc Stretch 2 reps;30 seconds      Ankle Exercises: Standing   SLS Firm in front of wall for HHA   left 24s, right 5s   Rebounder SLS on right and left   Tandem, rhomberg, sharp rhomberg, and SLS x10 each stance   Heel Raises Both;20 reps   slant board; cues for slow&controlled, press toe in to avoid   Other Standing Ankle Exercises Standing Static Lunges bilateral       Ankle Exercises: Seated   BAPS Sitting;10 reps   hold for 3 seconds Ev/Iv                 PT Education - 04/25/20 0844    Education Details Education on soreness  quality after exercising    Person(s) Educated Patient    Methods Explanation    Comprehension Verbalized understanding            PT Short Term Goals - 03/18/20 1423      PT SHORT TERM GOAL #1   Title Independent with initial HEP    Baseline needs HEP    Time 3    Period Weeks    Status New    Target Date 04/08/20      PT SHORT TERM GOAL #2   Title Increase right ankle inversion strength to 4+/5 to help improve ankle stability for gait/outdoor ambulation over uneven surfaces    Baseline 4/5    Time 3    Period Weeks    Status New    Target Date 04/08/20             PT Long Term Goals - 03/18/20 1424      PT LONG TERM GOAL #1   Title Tolerate standing and walking for periods at least 3-4 hours  at a time for work shifts with right ankle pain 3/10 or less    Baseline pain up to 8/10    Time 6    Period Weeks    Status New    Target Date 04/29/20      PT LONG TERM GOAL #2   Title Right ankle strength 5/5 to improve ankle stability for gait and outdoor ambulation over uneven surfaces    Baseline see objective-grossly 4 to 4+/5    Time 6    Period Weeks    Status New    Target Date 04/29/20      PT LONG TERM GOAL #3   Title Demonstrate Romberg on Airex eyes closed as well as bilat. tandem stance x 20 sec ea. 1st attempt for improved balance and proprioception for improved safety with ambulation    Baseline see objective, required >1 attempt    Time 6    Period Weeks    Status New    Target Date 04/29/20      PT LONG TERM GOAL #4   Title Increase right ankle PF AROM at least 20 deg to improve gait mechanics for toe off in terminal stance    Baseline 30 deg AROM    Time 6    Period Weeks    Status New    Target Date 04/29/20                 Plan - 04/25/20 0845    Clinical Impression Statement Pt reports 3/10 pain today and that pain overall is generally going down; pt [place] and that was well-tolerated. Pt  continues to struggle with SLS on the right ankle (~4s) as compared to left ankle (24s) on firm surface. Pt tolerated all exercises well today with minimal verbal cuing for heel raises and rebounder. Pt to be re-evaluated on the next visit secondary to possible upcoming surgical procedure.    Examination-Activity Limitations Stand;Locomotion Level    Examination-Participation Restrictions Occupation;Community Activity    Stability/Clinical Decision Making Evolving/Moderate complexity    Rehab Potential Fair    PT Treatment/Interventions ADLs/Self Care Home Management;Cryotherapy;Electrical Stimulation;Moist Heat;Iontophoresis 429mml Dexamethasone;Therapeutic activities;Functional mobility training;Gait training;Therapeutic exercise;Patient/family education;Manual techniques;Neuromuscular re-education;Balance training;Taping;Vasopneumatic Device    PT Next Visit Plan Re-evaluation toward prehab for upcoming surgery, strengthening    PT Home Exercise Plan Access code: [email or web address]-Theraband ankle DF and PF, ankle IV and EV isometrics, Romberg and tandem stance, ankle 4-way theraband strengthening. Verbal addition of gastroc&soleus stretching           Patient will benefit from skilled therapeutic intervention in order to improve the following deficits and impairments:  Pain, Decreased strength, Decreased activity tolerance, Increased edema, Decreased range of motion, Decreased balance, Difficulty walking, Hypomobility  Visit Diagnosis: Unstable right ankle  Sprain of ligament of right ankle, subsequent encounter  Pain in right ankle and joints of right foot  Closed nondisplaced fracture of body of right calcaneus with routine healing, subsequent encounter     Problem List There are no problems to display for this patient. Christopher Mccann DPT  04/25/2020    Christopher CirriSPT 04/25/2020, 9:54 AM  [facility]: [phone] Fax:  [phone]: Christopher BoehleRN: [id number] of Birth: 8/Feb 04, 2001   PHYSICAL THERAPY DISCHARGE SUMMARY  Visits from Start of Care: 8  Current functional level related to goals / functional outcomes:  See goals   Remaining deficits: Current status unknown   Education / Equipment: HEP, theraband, posture, lifting mechanics.   Plan: Patient agrees to discharge.  Patient goals were not met. Patient is being discharged due to not returning since the last visit.  ?????         Christopher Mccann PT, DPT, LAT, ATC  06/13/20  2:40 PM

## 2020-04-28 ENCOUNTER — Telehealth: Payer: Self-pay | Admitting: Physical Therapy

## 2020-04-28 ENCOUNTER — Ambulatory Visit: Payer: Medicaid Other | Admitting: Physical Therapy

## 2020-04-28 NOTE — Telephone Encounter (Signed)
Spoke with patient on the phone 10:55am; patient reported he forgot about the appointment until 9:00 am this morning. Patient stated that he has the 2nd opinion tomorrow and will let us know if the doctor recommends surgery or more PT and he will call and let us know if he needs more visits.  Johnn Hai, SPT

## 2020-04-29 ENCOUNTER — Ambulatory Visit: Payer: Medicaid Other | Admitting: Podiatry

## 2020-05-12 ENCOUNTER — Ambulatory Visit: Payer: Medicaid Other | Admitting: Podiatry

## 2020-06-06 ENCOUNTER — Ambulatory Visit: Payer: Medicaid Other | Admitting: Podiatry

## 2020-06-07 ENCOUNTER — Ambulatory Visit: Payer: Medicaid Other | Admitting: Podiatry

## 2020-06-09 ENCOUNTER — Ambulatory Visit: Payer: Medicaid Other | Admitting: Podiatry

## 2020-06-17 ENCOUNTER — Encounter: Payer: Self-pay | Admitting: Podiatry

## 2020-06-17 ENCOUNTER — Ambulatory Visit: Payer: Medicaid Other | Admitting: Podiatry

## 2020-06-17 ENCOUNTER — Other Ambulatory Visit: Payer: Self-pay

## 2020-06-17 DIAGNOSIS — M25371 Other instability, right ankle: Secondary | ICD-10-CM

## 2020-06-17 DIAGNOSIS — M949 Disorder of cartilage, unspecified: Secondary | ICD-10-CM

## 2020-06-17 DIAGNOSIS — S92014D Nondisplaced fracture of body of right calcaneus, subsequent encounter for fracture with routine healing: Secondary | ICD-10-CM | POA: Diagnosis not present

## 2020-06-17 DIAGNOSIS — S93401A Sprain of unspecified ligament of right ankle, initial encounter: Secondary | ICD-10-CM
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
## 2020-07-02 DIAGNOSIS — Z419 Encounter for procedure for purposes other than remedying health state, unspecified: Secondary | ICD-10-CM | POA: Diagnosis not present

## 2020-07-19 DIAGNOSIS — R509 Fever, unspecified: Secondary | ICD-10-CM | POA: Diagnosis not present

## 2020-07-19 DIAGNOSIS — U071 COVID-19: Secondary | ICD-10-CM | POA: Diagnosis not present

## 2020-08-02 DIAGNOSIS — Z419 Encounter for procedure for purposes other than remedying health state, unspecified: Secondary | ICD-10-CM | POA: Diagnosis not present

## 2020-08-25 ENCOUNTER — Other Ambulatory Visit: Payer: Self-pay

## 2020-08-25 ENCOUNTER — Other Ambulatory Visit
Admission: RE | Admit: 2020-08-25 | Discharge: 2020-08-25 | Disposition: A | Discharge status: Home / Self Care | Payer: Medicaid Other | Source: Ambulatory Visit | Attending: Podiatry | Admitting: Podiatry

## 2020-08-25 DIAGNOSIS — Z20822 Contact with and (suspected) exposure to covid-19: Secondary | ICD-10-CM | POA: Insufficient documentation

## 2020-08-25 DIAGNOSIS — Z01812 Encounter for preprocedural laboratory examination: Secondary | ICD-10-CM | POA: Diagnosis not present

## 2020-08-25 DIAGNOSIS — Z0000 Encounter for general adult medical examination without abnormal findings: Secondary | ICD-10-CM | POA: Diagnosis not present

## 2020-08-25 HISTORY — DX: Pain in right ankle and joints of right foot: M25.571

## 2020-08-25 NOTE — Patient Instructions (Signed)
Your procedure is scheduled on: August 30, 2020 TUESDAY Report to the Registration Desk on the 1st floor of the [facility] To find out your arrival time, please call [phone] between 1PM - 3PM on: August 29, 2020  REMEMBER: Instructions that are not followed completely may result in serious medical risk, up to and including death; or upon the discretion of your surgeon and anesthesiologist your surgery may need to be rescheduled.  Do not eat OR DRINK after midnight the night before surgery.  No gum chewing, lozengers or hard candies.  TAKE THESE MEDICATIONS THE MORNING OF SURGERY WITH A SIP OF WATER: NONE  One week prior to surgery: Stop Anti-inflammatories (NSAIDS) such as Advil, Aleve, Ibuprofen, Motrin, Naproxen, Naprosyn and ASPIRIN OR Aspirin based products such as Excedrin, Goodys Powder, BC Powder. Stop ANY OVER THE COUNTER supplements until after surgery.  No Alcohol for 24 hours before or after surgery.  No Smoking including e-cigarettes for 24 hours prior to surgery.  No chewable tobacco products for at least 6 hours prior to surgery.  No nicotine patches on the day of surgery.  Do not use any "recreational" drugs for at least a week prior to your surgery.  Please be advised that the combination of cocaine and anesthesia may have negative outcomes, up to and including death. If you test positive for cocaine, your surgery will be cancelled.  On the morning of surgery brush your teeth with toothpaste and water, you may rinse your mouth with mouthwash if you wish. Do not swallow any toothpaste or mouthwash.  Do not wear jewelry, make-up, hairpins, clips or nail polish.  Do not wear lotions, powders, or perfumes.   Do not shave body from the neck down 48 hours prior to surgery just in case you cut yourself which could leave a site for infection.  Also, freshly shaved skin may become irritated if using the CHG soap.  Contact lenses, hearing aids and dentures may not  be worn into surgery.  Do not bring valuables to the hospital. [facility] is not responsible for any missing/lost belongings or valuables.   Use CHG Soap as directed on instruction sheet.  If you have a CPAP bring your C-PAP to the hospital with you in case you may have to spend the night.   Notify your doctor if there is any change in your medical condition (cold, fever, infection).  Wear comfortable clothing (specific to your surgery type) to the hospital.  Plan for stool softeners for home use; pain medications have a tendency to cause constipation. You can also help prevent constipation by eating foods high in fiber such as fruits and vegetables and drinking plenty of fluids as your diet allows.  After surgery, you can help prevent lung complications by doing breathing exercises.  Take deep breaths and cough every 1-2 hours. Your doctor may order a device called an Incentive Spirometer to help you take deep breaths. When coughing or sneezing, hold a pillow firmly against your incision with both hands. This is called "splinting." Doing this helps protect your incision. It also decreases belly discomfort.  If you are being admitted to the hospital overnight, leave your suitcase in the car. After surgery it may be brought to your room.  If you are being discharged the day of surgery, you will not be allowed to drive home. You will need a responsible adult (18 years or older) to drive you home and stay with you that night.   If you are  taking public transportation, you will need to have a responsible adult (18 years or older) with you. Please confirm with your physician that it is acceptable to use public transportation.   Please call the Pre-admissions Testing Dept. at [phone] if you have any questions about these instructions.  Visitation Policy:  Patients undergoing a surgery or procedure may have one family member or support person with them as long as that person is not  COVID-19 positive or experiencing its symptoms.  That person may remain in the waiting area during the procedure.  Inpatient Visitation:    Visiting hours are 7 a.m. to 8 p.m. Patients will be allowed one visitor. The visitor may change daily. The visitor must pass COVID-19 screenings, use hand sanitizer when entering and exiting the patient's room and wear a mask at all times, including in the patient's room. Patients must also wear a mask when staff or their visitor are in the room. Masking is required regardless of vaccination status. Systemwide, no visitors 17 or younger.  Visitation Policy Changes: The following changes are to take effect on Feb. 28 at 7 a.m.  No visitors under the age of 80. Any visitor under the age of 41 must be accompanied by an adult. Adult inpatients: Two visitors will be allowed daily and the visitors may change each day during the patient's stay. (Please note -- no changes at this time for the [facility], [facility] and inpatients, Emergency Departments, Ambulatory Sites, [facility], medical practices and procedural areas.)

## 2020-08-26 LAB — SARS CORONAVIRUS 2 (TAT 6-24 HRS): SARS Coronavirus 2: NEGATIVE

## 2020-08-29 ENCOUNTER — Telehealth: Payer: Self-pay

## 2020-08-29 MED ORDER — CEFAZOLIN SODIUM-DEXTROSE 2-4 GM/100ML-% IV SOLN
2.0000 g | INTRAVENOUS | Status: AC
  Administered 2020-08-30: 2 g via INTRAVENOUS

## 2020-08-29 MED ORDER — FAMOTIDINE 20 MG PO TABS: 20.0000 mg | ORAL_TABLET | Freq: Once | ORAL | Status: AC

## 2020-08-29 NOTE — Telephone Encounter (Signed)
DOS 08/30/2020   ARTHROSCOPY AIDED REPAIR OF TALAR DOME FRACTURE RT - 29892 OPEN OSTEOCHONDRAL AUTOGRAFT, TALUS W/GRAFT RT - 28446 ARTHROSCOPY RT ANKLE W/DEBRIDMENT - 29897  SPOKE TO STEPHANIE AT Delcie Roch STATED 58527 IS BUNDLED WITH [phone]. [phone] DON'T REQUIRE AUTH. CODE 28446 CAN'T BE APPROVED PRIOR TO SURGERY. IF DR. MCDONALD USES 28446 WE HAVE TO SUBMIT OP NOTES WITH CLAIM TO BE APPROVED. CASE # [id number]

## 2020-08-30 ENCOUNTER — Ambulatory Visit: Payer: Medicaid Other

## 2020-08-30 ENCOUNTER — Other Ambulatory Visit: Payer: Self-pay

## 2020-08-30 ENCOUNTER — Ambulatory Visit
Admission: RE | Admit: 2020-08-30 | Discharge: 2020-08-30 | Disposition: A | Discharge status: Home / Self Care | Payer: Medicaid Other | Attending: Podiatry | Admitting: Podiatry

## 2020-08-30 ENCOUNTER — Encounter: Payer: Self-pay | Admitting: Podiatry

## 2020-08-30 ENCOUNTER — Ambulatory Visit: Payer: Medicaid Other | Admitting: Anesthesiology

## 2020-08-30 ENCOUNTER — Encounter
Admission: RE | Disposition: A | Discharge status: Home / Self Care | Payer: Self-pay | Source: Home / Self Care | Attending: Podiatry

## 2020-08-30 DIAGNOSIS — M85679 Other cyst of bone, unspecified ankle and foot: Secondary | ICD-10-CM

## 2020-08-30 DIAGNOSIS — Z419 Encounter for procedure for purposes other than remedying health state, unspecified: Secondary | ICD-10-CM

## 2020-08-30 DIAGNOSIS — M65171 Other infective (teno)synovitis, right ankle and foot: Secondary | ICD-10-CM

## 2020-08-30 DIAGNOSIS — M958 Other specified acquired deformities of musculoskeletal system: Secondary | ICD-10-CM | POA: Diagnosis not present

## 2020-08-30 DIAGNOSIS — M25571 Pain in right ankle and joints of right foot: Secondary | ICD-10-CM | POA: Diagnosis not present

## 2020-08-30 DIAGNOSIS — Z888 Allergy status to other drugs, medicaments and biological substances status: Secondary | ICD-10-CM | POA: Insufficient documentation

## 2020-08-30 DIAGNOSIS — M65971 Unspecified synovitis and tenosynovitis, right ankle and foot: Secondary | ICD-10-CM

## 2020-08-30 DIAGNOSIS — S99911A Unspecified injury of right ankle, initial encounter: Secondary | ICD-10-CM | POA: Diagnosis not present

## 2020-08-30 DIAGNOSIS — M659 Synovitis and tenosynovitis, unspecified: Secondary | ICD-10-CM | POA: Insufficient documentation

## 2020-08-30 DIAGNOSIS — M93271 Osteochondritis dissecans, right ankle and joints of right foot: Secondary | ICD-10-CM | POA: Diagnosis not present

## 2020-08-30 DIAGNOSIS — G8918 Other acute postprocedural pain: Secondary | ICD-10-CM | POA: Diagnosis not present

## 2020-08-30 DIAGNOSIS — S93401A Sprain of unspecified ligament of right ankle, initial encounter: Secondary | ICD-10-CM | POA: Diagnosis not present

## 2020-08-30 HISTORY — PX: OSTEOCHONDROMA EXCISION: SHX2137

## 2020-08-30 SURGERY — EXCISION, OSTEOCHONDROMA
Anesthesia: General | Site: Ankle | Laterality: Right

## 2020-08-30 MED ORDER — FENTANYL CITRATE (PF) 100 MCG/2ML IJ SOLN
INTRAMUSCULAR | Status: AC
  Administered 2020-08-30: 25 ug via INTRAVENOUS
  Filled 2020-08-30: qty 2

## 2020-08-30 MED ORDER — FENTANYL CITRATE (PF) 100 MCG/2ML IJ SOLN
INTRAMUSCULAR | Status: AC
  Filled 2020-08-30: qty 2

## 2020-08-30 MED ORDER — MIDAZOLAM HCL 2 MG/2ML IJ SOLN
INTRAMUSCULAR | Status: AC
  Administered 2020-08-30: 2 mg via INTRAVENOUS
  Filled 2020-08-30: qty 2

## 2020-08-30 MED ORDER — ROPIVACAINE HCL 5 MG/ML IJ SOLN
INTRAMUSCULAR | Status: DC | PRN
  Administered 2020-08-30: 20 mL via EPIDURAL

## 2020-08-30 MED ORDER — DEXAMETHASONE SODIUM PHOSPHATE 10 MG/ML IJ SOLN
INTRAMUSCULAR | Status: DC | PRN
  Administered 2020-08-30: 10 mg via INTRAVENOUS

## 2020-08-30 MED ORDER — HYDROMORPHONE HCL 1 MG/ML IJ SOLN
INTRAMUSCULAR | Status: DC | PRN
  Administered 2020-08-30: .5 mg via INTRAVENOUS

## 2020-08-30 MED ORDER — ACETAMINOPHEN 500 MG PO TABS: 1000.0000 mg | ORAL_TABLET | Freq: Four times a day (QID) | ORAL | 0 refills | Status: AC | PRN

## 2020-08-30 MED ORDER — CHLORHEXIDINE GLUCONATE 0.12 % MT SOLN
OROMUCOSAL | Status: AC
  Administered 2020-08-30: 15 mL via OROMUCOSAL
  Filled 2020-08-30: qty 15

## 2020-08-30 MED ORDER — ONDANSETRON HCL 4 MG/2ML IJ SOLN
INTRAMUSCULAR | Status: AC
  Filled 2020-08-30: qty 2

## 2020-08-30 MED ORDER — PHENYLEPHRINE HCL (PRESSORS) 10 MG/ML IV SOLN
INTRAVENOUS | Status: DC | PRN
  Administered 2020-08-30 (×2): 100 ug via INTRAVENOUS

## 2020-08-30 MED ORDER — ACETAMINOPHEN 10 MG/ML IV SOLN
INTRAVENOUS | Status: AC
  Filled 2020-08-30: qty 100

## 2020-08-30 MED ORDER — PROPOFOL 10 MG/ML IV BOLUS
INTRAVENOUS | Status: DC | PRN
  Administered 2020-08-30: 200 mg via INTRAVENOUS

## 2020-08-30 MED ORDER — CEFAZOLIN SODIUM-DEXTROSE 2-4 GM/100ML-% IV SOLN
INTRAVENOUS | Status: AC
  Filled 2020-08-30: qty 100

## 2020-08-30 MED ORDER — ORAL CARE MOUTH RINSE: 15.0000 mL | Freq: Once | OROMUCOSAL | Status: AC

## 2020-08-30 MED ORDER — GLYCOPYRROLATE 0.2 MG/ML IJ SOLN
INTRAMUSCULAR | Status: DC | PRN
  Administered 2020-08-30: .2 mg via INTRAVENOUS

## 2020-08-30 MED ORDER — OXYCODONE HCL 5 MG PO TABS: ORAL_TABLET | ORAL | 0 refills | Status: AC

## 2020-08-30 MED ORDER — MIDAZOLAM HCL 2 MG/2ML IJ SOLN
INTRAMUSCULAR | Status: DC | PRN
  Administered 2020-08-30: 2 mg via INTRAVENOUS

## 2020-08-30 MED ORDER — GABAPENTIN 300 MG PO CAPS: 300.0000 mg | ORAL_CAPSULE | Freq: Three times a day (TID) | ORAL | 0 refills | Status: AC

## 2020-08-30 MED ORDER — LACTATED RINGERS IV SOLN: INTRAVENOUS | Status: DC

## 2020-08-30 MED ORDER — ONDANSETRON HCL 4 MG/2ML IJ SOLN
INTRAMUSCULAR | Status: DC | PRN
  Administered 2020-08-30: 4 mg via INTRAVENOUS

## 2020-08-30 MED ORDER — FENTANYL CITRATE (PF) 100 MCG/2ML IJ SOLN: 25.0000 ug | INTRAMUSCULAR | Status: DC | PRN

## 2020-08-30 MED ORDER — FENTANYL CITRATE (PF) 100 MCG/2ML IJ SOLN
INTRAMUSCULAR | Status: DC | PRN
  Administered 2020-08-30: 50 ug via INTRAVENOUS
  Administered 2020-08-30: 100 ug via INTRAVENOUS
  Administered 2020-08-30: 25 ug via INTRAVENOUS
  Administered 2020-08-30 (×2): 50 ug via INTRAVENOUS
  Administered 2020-08-30: 25 ug via INTRAVENOUS

## 2020-08-30 MED ORDER — HYDROMORPHONE HCL 1 MG/ML IJ SOLN
INTRAMUSCULAR | Status: AC
  Filled 2020-08-30: qty 1

## 2020-08-30 MED ORDER — MIDAZOLAM HCL 2 MG/2ML IJ SOLN: 1.0000 mg | Freq: Once | INTRAMUSCULAR | Status: AC

## 2020-08-30 MED ORDER — LIDOCAINE HCL (PF) 1 % IJ SOLN
INTRAMUSCULAR | Status: DC | PRN
  Administered 2020-08-30: 3 mL via SUBCUTANEOUS

## 2020-08-30 MED ORDER — VANCOMYCIN HCL 1000 MG IV SOLR
INTRAVENOUS | Status: DC | PRN
  Administered 2020-08-30: 1000 mg

## 2020-08-30 MED ORDER — ONDANSETRON HCL 4 MG/2ML IJ SOLN
4.0000 mg | Freq: Once | INTRAMUSCULAR | Status: AC | PRN
  Administered 2020-08-30: 4 mg via INTRAVENOUS

## 2020-08-30 MED ORDER — CHLORHEXIDINE GLUCONATE 0.12 % MT SOLN: 15.0000 mL | Freq: Once | OROMUCOSAL | Status: AC

## 2020-08-30 MED ORDER — FENTANYL CITRATE (PF) 100 MCG/2ML IJ SOLN: 50.0000 ug | Freq: Once | INTRAMUSCULAR | Status: AC

## 2020-08-30 MED ORDER — MIDAZOLAM HCL 2 MG/2ML IJ SOLN
INTRAMUSCULAR | Status: AC
  Filled 2020-08-30: qty 2

## 2020-08-30 MED ORDER — PROPOFOL 10 MG/ML IV BOLUS
INTRAVENOUS | Status: AC
  Filled 2020-08-30: qty 20

## 2020-08-30 MED ORDER — EPINEPHRINE PF 1 MG/ML IJ SOLN
INTRAMUSCULAR | Status: AC
  Filled 2020-08-30: qty 2

## 2020-08-30 MED ORDER — ACETAMINOPHEN 10 MG/ML IV SOLN
INTRAVENOUS | Status: DC | PRN
  Administered 2020-08-30: 1000 mg via INTRAVENOUS

## 2020-08-30 MED ORDER — VANCOMYCIN HCL 1000 MG IV SOLR
INTRAVENOUS | Status: AC
  Filled 2020-08-30: qty 1000

## 2020-08-30 MED ORDER — LACTATED RINGERS IV SOLN
INTRAVENOUS | Status: DC | PRN
  Administered 2020-08-30 (×2): 3001 mL

## 2020-08-30 MED ORDER — ROPIVACAINE HCL 5 MG/ML IJ SOLN
INTRAMUSCULAR | Status: AC
  Filled 2020-08-30: qty 30

## 2020-08-30 MED ORDER — LIDOCAINE HCL (CARDIAC) PF 100 MG/5ML IV SOSY
PREFILLED_SYRINGE | INTRAVENOUS | Status: DC | PRN
  Administered 2020-08-30: 100 mg via INTRAVENOUS

## 2020-08-30 MED ORDER — FAMOTIDINE 20 MG PO TABS
ORAL_TABLET | ORAL | Status: AC
  Administered 2020-08-30: 20 mg via ORAL
  Filled 2020-08-30: qty 1

## 2020-08-30 MED ORDER — PHENYLEPHRINE HCL-NACL 10-0.9 MG/250ML-% IV SOLN
INTRAVENOUS | Status: DC | PRN
  Administered 2020-08-30: 50 ug/min via INTRAVENOUS

## 2020-08-30 MED ORDER — LIDOCAINE HCL (PF) 2 % IJ SOLN
INTRAMUSCULAR | Status: AC
  Filled 2020-08-30: qty 5

## 2020-08-30 MED ORDER — LIDOCAINE HCL (CARDIAC) PF 100 MG/5ML IV SOSY: PREFILLED_SYRINGE | INTRAVENOUS | Status: DC | PRN

## 2020-08-30 MED ORDER — ASPIRIN EC 325 MG PO TBEC: 325.0000 mg | DELAYED_RELEASE_TABLET | Freq: Two times a day (BID) | ORAL | 0 refills | Status: AC

## 2020-08-30 SURGICAL SUPPLY — 96 items
ADAPTER IRRIG TUBE 2 SPIKE SOL (ADAPTER) IMPLANT
ADPR TBG 2 SPK PMP STRL ASCP (ADAPTER)
APL PRP STRL LF DISP 70% ISPRP (MISCELLANEOUS) ×1
ARTHROWAND PARAGON T2 (SURGICAL WAND)
BLADE FULL RADIUS 2.9 (BLADE) IMPLANT
BLADE SHAVER 2.9D 7 MINI (BLADE) ×2 IMPLANT
BLADE SHAVER AGGRES 3.5 (CUTTER) IMPLANT
BLADE SURG 15 STRL LF DISP TIS (BLADE) ×2 IMPLANT
BLADE SURG 15 STRL SS (BLADE) ×4
BNDG CMPR STD VLCR NS LF 5.8X4 (GAUZE/BANDAGES/DRESSINGS)
BNDG COHESIVE 4X5 TAN STRL (GAUZE/BANDAGES/DRESSINGS) ×2 IMPLANT
BNDG CONFORM 2 STRL LF (GAUZE/BANDAGES/DRESSINGS) ×2 IMPLANT
BNDG CONFORM 3 STRL LF (GAUZE/BANDAGES/DRESSINGS) IMPLANT
BNDG ELASTIC 4X5.8 VLCR NS LF (GAUZE/BANDAGES/DRESSINGS) IMPLANT
BNDG ELASTIC 4X5.8 VLCR STR LF (GAUZE/BANDAGES/DRESSINGS) IMPLANT
BNDG ELASTIC 6X5.8 VLCR STR LF (GAUZE/BANDAGES/DRESSINGS) ×4 IMPLANT
BNDG ESMARK 4X12 TAN STRL LF (GAUZE/BANDAGES/DRESSINGS) ×2 IMPLANT
BNDG GAUZE 4.5X4.1 6PLY STRL (MISCELLANEOUS) ×4 IMPLANT
BONE GRAFT HARVESTER 8 (INSTRUMENTS) ×2 IMPLANT
BUR ABRADER 4.0 W/FLUTE AQUA (MISCELLANEOUS) IMPLANT
BUR AGGRESSIVE+ 2.5 (BURR) IMPLANT
BURR ABRADER 4.0 W/FLUTE AQUA (MISCELLANEOUS)
CHLORAPREP W/TINT 26 (MISCELLANEOUS) ×2 IMPLANT
COVER WAND RF STERILE (DRAPES) IMPLANT
CUFF TOURN SGL QUICK 18X4 (TOURNIQUET CUFF) IMPLANT
CUFF TOURN SGL QUICK 24 (TOURNIQUET CUFF)
CUFF TOURN SGL QUICK 34 (TOURNIQUET CUFF) ×2
CUFF TRNQT CYL 24X4X16.5-23 (TOURNIQUET CUFF) IMPLANT
CUFF TRNQT CYL 34X4.125X (TOURNIQUET CUFF) ×1 IMPLANT
DECANTER SPIKE VIAL GLASS SM (MISCELLANEOUS) IMPLANT
DRAPE C-ARM XRAY 36X54 (DRAPES) ×2 IMPLANT
DRAPE IMP U-DRAPE 54X76 (DRAPES) ×2 IMPLANT
DRAPE SHEET LG 3/4 BI-LAMINATE (DRAPES) ×2 IMPLANT
DRAPE U-SHAPE 47X51 STRL (DRAPES) ×2 IMPLANT
DRSG EMULSION OIL 3X3 NADH (GAUZE/BANDAGES/DRESSINGS) IMPLANT
DURAPREP 26ML APPLICATOR (WOUND CARE) IMPLANT
ELECT REM PT RETURN 9FT ADLT (ELECTROSURGICAL) ×2
ELECTRODE REM PT RTRN 9FT ADLT (ELECTROSURGICAL) ×1 IMPLANT
ETHIBOND 2 0 GREEN CT 2 30IN (SUTURE) IMPLANT
GAUZE SPONGE 4X4 12PLY STRL (GAUZE/BANDAGES/DRESSINGS) ×2 IMPLANT
GAUZE XEROFORM 1X8 LF (GAUZE/BANDAGES/DRESSINGS) ×2 IMPLANT
GLOVE INDICATOR 8.0 STRL GRN (GLOVE) ×4 IMPLANT
GLOVE SURG LTX SZ7 (GLOVE) ×4 IMPLANT
GLOVE SURG UNDER POLY LF SZ7.5 (GLOVE) ×4 IMPLANT
GOWN STRL REUS W/ TWL XL LVL3 (GOWN DISPOSABLE) ×3 IMPLANT
GOWN STRL REUS W/TWL LRG LVL3 (GOWN DISPOSABLE) ×4 IMPLANT
GOWN STRL REUS W/TWL XL LVL3 (GOWN DISPOSABLE) ×6
GRAFT TISSUE BIOCARTILAGE .75 (Tissue) ×4 IMPLANT
HANDLE YANKAUER SUCT BULB TIP (MISCELLANEOUS) ×2 IMPLANT
IV LACTATED RINGER IRRG 3000ML (IV SOLUTION) ×6
IV LR IRRIG 3000ML ARTHROMATIC (IV SOLUTION) ×3 IMPLANT
K-WIRE .062X4 (WIRE) ×4 IMPLANT
KIT BIOCARTILAGE LG JOINT MIX (KITS) ×2 IMPLANT
KIT TURNOVER KIT A (KITS) ×2 IMPLANT
LABEL OR SOLS (LABEL) ×2 IMPLANT
MANIFOLD NEPTUNE II (INSTRUMENTS) ×4 IMPLANT
NDL SAFETY ECLIPSE 18X1.5 (NEEDLE) IMPLANT
NEEDLE HYPO 18GX1.5 SHARP (NEEDLE)
NEEDLE HYPO 25X1 1.5 SAFETY (NEEDLE) IMPLANT
NS IRRIG 500ML POUR BTL (IV SOLUTION) ×4 IMPLANT
PACK ARTHROSCOPY KNEE (MISCELLANEOUS) ×2 IMPLANT
PAD ABD DERMACEA PRESS 5X9 (GAUZE/BANDAGES/DRESSINGS) ×4 IMPLANT
PAD CAST 4YDX4 CTTN HI CHSV (CAST SUPPLIES) ×5 IMPLANT
PADDING CAST COTTON 4X4 STRL (CAST SUPPLIES) ×10
PENCIL ELECTRO HAND CTR (MISCELLANEOUS) IMPLANT
PENCIL SMOKE EVACUATOR (MISCELLANEOUS) ×2 IMPLANT
SET SCOPE WA 2.7 30D LOANER (INSTRUMENTS) ×2 IMPLANT
SET TUBE SUCT SHAVER OUTFL 24K (TUBING) ×2 IMPLANT
SPLINT CAST 1 STEP 4X30 (MISCELLANEOUS) IMPLANT
STOCKINETTE M/LG 89821 (MISCELLANEOUS) ×2 IMPLANT
STRAP ANKLE DISTRACTOR (MISCELLANEOUS) ×2 IMPLANT
STRAP ANKLE FOOT DISTRACTOR (ORTHOPEDIC SUPPLIES) ×2 IMPLANT
STRAP SAFETY 5IN WIDE (MISCELLANEOUS) ×2 IMPLANT
SUCTION FRAZIER HANDLE 10FR (MISCELLANEOUS) ×1
SUCTION TUBE FRAZIER 10FR DISP (MISCELLANEOUS) ×1 IMPLANT
SUT ETH BLK MONO 3 0 FS 1 12/B (SUTURE) ×4 IMPLANT
SUT ETHIBOND GREEN BRAID 0S 4 (SUTURE) IMPLANT
SUT ETHILON 3 0 PS 1 (SUTURE) IMPLANT
SUT ETHILON 4 0 PS 2 18 (SUTURE) IMPLANT
SUT ETHILON 4-0 (SUTURE)
SUT ETHILON 4-0 FS2 18XMFL BLK (SUTURE)
SUT MNCRL 3-0 UNDYED SH (SUTURE) ×1 IMPLANT
SUT MONOCRYL 3-0 UNDYED (SUTURE) ×1
SUT PDS II 3-0 (SUTURE) IMPLANT
SUT VIC AB 3-0 SH 27 (SUTURE) ×2
SUT VIC AB 3-0 SH 27X BRD (SUTURE) ×1 IMPLANT
SUT VIC AB 4-0 SH 27 (SUTURE) ×2
SUT VIC AB 4-0 SH 27XANBCTRL (SUTURE) ×1 IMPLANT
SUTURE ETHLN 4-0 FS2 18XMF BLK (SUTURE) IMPLANT
TISSEEL 4ML HEMOSTATIC FIBRIN (Miscellaneous) ×2 IMPLANT
TUBING ARTHRO INFLOW-ONLY STRL (TUBING) ×2 IMPLANT
TUBING CONNECTING 10 (TUBING) ×2 IMPLANT
WAND ARTHRO PARAGON T2 (SURGICAL WAND) IMPLANT
WAND COVAC 50 IFS (MISCELLANEOUS) IMPLANT
WAND TOPAZ MICRO DEBRIDER (MISCELLANEOUS) IMPLANT
WATER STERILE IRR 1000ML POUR (IV SOLUTION) IMPLANT

## 2020-08-30 NOTE — Brief Op Note (Signed)
08/30/2020  3:04 PM  PATIENT:  Christopher Mccann  20 y.o. male  PRE-OPERATIVE DIAGNOSIS:  OSTEOCHONDRAL TALAR DOME LESION,SPRAIN OF RIGHT ANKLE  POST-OPERATIVE DIAGNOSIS:  OSTEOCHONDRAL TALAR DOME LESION,SPRAIN OF RIGHT ANKLE  PROCEDURE:  Procedure(s) with comments: OSTEOCHONDROMA EXCISION VS ARTHROSCOPY ANKLE DEBRIDEMENT WITH OPEN OSTEOCHONDRAL AUTOGRAFT (Right) - BLOCK  SURGEON:  Surgeon(s) and Role:    * Walsie Smeltz, Rachelle Hora, DPM - Primary    * Candelaria Stagers, DPM - Assisting  PHYSICIAN ASSISTANT:   ASSISTANTS: none   ANESTHESIA:   regional and general  EBL:  2 mL   BLOOD ADMINISTERED:none  DRAINS: none   LOCAL MEDICATIONS USED:  NONE  SPECIMEN:  No Specimen  DISPOSITION OF SPECIMEN:  N/A  COUNTS:  YES  TOURNIQUET:   Total Tourniquet Time Documented: Thigh (Right) - 120 minutes Total: Thigh (Right) - 120 minutes   DICTATION: .Note written in EPIC  PLAN OF CARE: Discharge to home after PACU  PATIENT DISPOSITION:  PACU - hemodynamically stable.   Delay start of Pharmacological VTE agent (>24hrs) due to surgical blood loss or risk of bleeding: yes

## 2020-08-30 NOTE — H&P (Signed)
History and Physical Interval Note:  08/30/2020 11:28 AM  Christopher Mccann  has presented today for surgery, with the diagnosis of right ankle cartilage lesion.  The various methods of treatment have been discussed with the patient and family. After consideration of risks, benefits and other options for treatment, the patient has consented to   REPAIR OF RIGHT ANKLE CARTILAGE LESION WITH BONE AND/OR CARTILAGE GRAFT as a surgical intervention.  The patient's history has been reviewed, patient examined, no change in status, stable for surgery.  I have reviewed the patient's chart and labs.  Questions were answered to the patient's satisfaction.     Edwin Cap

## 2020-08-30 NOTE — Anesthesia Procedure Notes (Signed)
Procedure Name: LMA Insertion Date/Time: 08/30/2020 12:16 PM Performed by: Henrietta Hoover, CRNA Pre-anesthesia Checklist: Emergency Drugs available, Patient identified, Suction available and Patient being monitored Patient Re-evaluated:Patient Re-evaluated prior to induction Oxygen Delivery Method: Circle system utilized Preoxygenation: Pre-oxygenation with 100% oxygen Induction Type: IV induction LMA: LMA inserted LMA Size: 4.5 Number of attempts: 1 Placement Confirmation: positive ETCO2 and breath sounds checked- equal and bilateral Tube secured with: Tape Dental Injury: Teeth and Oropharynx as per pre-operative assessment

## 2020-08-30 NOTE — Transfer of Care (Signed)
Immediate Anesthesia Transfer of Care Note  Patient: Christopher Mccann  Procedure(s) Performed: OSTEOCHONDROMA EXCISION VS ARTHROSCOPY ANKLE DEBRIDEMENT WITH OPEN OSTEOCHONDRAL AUTOGRAFT (Right Ankle)  Patient Location: PACU  Anesthesia Type:General  Level of Consciousness: sedated  Airway & Oxygen Therapy: Patient Spontanous Breathing and Patient connected to face mask oxygen  Post-op Assessment: Report given to RN and Post -op Vital signs reviewed and stable  Post vital signs: Reviewed and stable  Last Vitals:  Vitals Value Taken Time  BP 131/61 08/30/20 1503  Temp    Pulse 78 08/30/20 1508  Resp 26 08/30/20 1508  SpO2 99 % 08/30/20 1508  Vitals shown include unvalidated device data.  Last Pain:  Vitals:   08/30/20 0934  TempSrc:   PainSc: Asleep      Patients Stated Pain Goal: 0 (08/30/20 0909)  Complications: No complications documented.

## 2020-08-30 NOTE — Anesthesia Procedure Notes (Signed)
Anesthesia Regional Block: Popliteal block   Pre-Anesthetic Checklist: ,, timeout performed, Correct Patient, Correct Site, Correct Laterality, Correct Procedure, Correct Position, site marked, Risks and benefits discussed,  Surgical consent,  Pre-op evaluation,  At surgeon's request and post-op pain management  Laterality: Lower and Right  Prep: chloraprep       Needles:  Injection technique: Single-shot  Needle Type: Echogenic Needle     Needle Length: 9cm  Needle Gauge: 21     Additional Needles:   Procedures:,,,, ultrasound used (permanent image in chart),,,,  Narrative:  Start time: 08/30/2020 9:39 AM End time: 08/30/2020 9:44 AM Injection made incrementally with aspirations every 5 mL.  Performed by: Personally  Anesthesiologist: Piscitello, Cleda Mccreedy, MD  Additional Notes: Patient consented for risk and benefits of nerve block including but not limited to nerve damage, failed block, bleeding and infection.  Patient voiced understanding.  Functioning IV was confirmed and monitors were applied.  Timeout done prior to procedure and prior to any sedation being given to the patient.  Patient confirmed procedure site prior to any sedation given to the patient. Sterile prep,hand hygiene and sterile gloves were used.  Minimal sedation used for procedure.  No paresthesia endorsed by patient during the procedure.  Negative aspiration and negative test dose prior to incremental administration of local anesthetic. The patient tolerated the procedure well with no immediate complications.

## 2020-08-30 NOTE — Discharge Instructions (Signed)
AMBULATORY SURGERY  DISCHARGE INSTRUCTIONS   1) The drugs that you were given will stay in your system until tomorrow so for the next 24 hours you should not:  A) Drive an automobile B) Make any legal decisions C) Drink any alcoholic beverage   2) You may resume regular meals tomorrow.  Today it is better to start with liquids and gradually work up to solid foods.  You may eat anything you prefer, but it is better to start with liquids, then soup and crackers, and gradually work up to solid foods.   3) Please notify your doctor immediately if you have any unusual bleeding, trouble breathing, redness and pain at the surgery site, drainage, fever, or pain not relieved by medication. 4)   5) Your post-operative visit with Dr.                                     is: Date:                        Time:    Please call to schedule your post-operative visit.  6) Additional Instructions:     Post-Surgery Instructions  1. If you are recuperating from surgery anywhere other than home, please be sure to leave [place] a number where you can be reached. 2. Go directly home and rest. 3. The keep operated foot (or feet) elevated six inches above the hip when sitting or lying down. 4. Support the elevated foot and leg with pillows under the calf. DO NOT PLACE PILLOWS UNDER THE KNEE. 5. DO NOT REMOVE or get your bandages wet. This will increase your chances of getting an infection. 6. Wear your surgical shoe at all times when you are up. 7. A limited amount of pain and swelling may occur. The skin may take on a bruised appearance. This is no cause for alarm. 8. For slight pain and swelling, apply an ice pack directly over the bandage for 15 minutes every hour. Continue icing until seen in the office. DO NOT apply any form of heat to the area. 9. Have prescription(s) filled immediately and take as directed. 10. Drink lots of liquids, water, and juice. 11. CALL THE OFFICE IMMEDIATELY IF: a.  Bleeding continues b. Pain increases and/or does not respond to medication c. Bandage or cast appears too tight d. Any liquids (water, coffee, etc.) have spilled on your bandages. e. Tripping, falling, or stubbing the surgical foot f. If your temperature rises above 101 g. If you have ANY questions at all 12. Please use the crutches, knee scooter, or walker you have prescribed, rented, or purchased. If you are non-weight bearing DO NOT put weight on the operated foot for _________ days. If you are weight-bearing, follow your physicians instructions. You are expected to be: ? weight-bearing X non-weight bearing 13. Special Instructions: _____________________________________________________________ _________________________________________________________________________________ _________________________________________________________________________________  14. Your next appointment is: 09/06/2020 10:45 AM  If you need to reach the nurse for any reason, please call: [place]: [phone] [place]: [phone] [place]: [phone]

## 2020-08-30 NOTE — Anesthesia Preprocedure Evaluation (Signed)
Anesthesia Evaluation  Patient identified by MRN, date of birth, ID band Patient awake    Reviewed: Allergy & Precautions, H&P , NPO status , Patient's Chart, lab work & pertinent test results, reviewed documented beta blocker date and time   Airway Mallampati: III  TM Distance: >3 FB Neck ROM: full    Dental  (+) Teeth Intact   Pulmonary neg pulmonary ROS,    Pulmonary exam normal        Cardiovascular Exercise Tolerance: Good negative cardio ROS Normal cardiovascular exam Rate:Normal     Neuro/Psych PSYCHIATRIC DISORDERS Depression negative neurological ROS     GI/Hepatic negative GI ROS, Neg liver ROS,   Endo/Other  negative endocrine ROS  Renal/GU negative Renal ROS  negative genitourinary   Musculoskeletal   Abdominal   Peds  Hematology negative hematology ROS (+)   Anesthesia Other Findings   Reproductive/Obstetrics negative OB ROS                             Anesthesia Physical Anesthesia Plan  ASA: II  Anesthesia Plan: General LMA   Post-op Pain Management:  Regional for Post-op pain   Induction:   PONV Risk Score and Plan: 3  Airway Management Planned:   Additional Equipment:   Intra-op Plan:   Post-operative Plan:   Informed Consent: I have reviewed the patients History and Physical, chart, labs and discussed the procedure including the risks, benefits and alternatives for the proposed anesthesia with the patient or authorized representative who has indicated his/her understanding and acceptance.       Plan Discussed with: CRNA  Anesthesia Plan Comments:         Anesthesia Quick Evaluation

## 2020-08-31 DIAGNOSIS — M659 Synovitis and tenosynovitis, unspecified: Secondary | ICD-10-CM

## 2020-08-31 DIAGNOSIS — M85679 Other cyst of bone, unspecified ankle and foot: Secondary | ICD-10-CM

## 2020-08-31 DIAGNOSIS — M958 Other specified acquired deformities of musculoskeletal system: Secondary | ICD-10-CM
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
## 2020-08-31 NOTE — Anesthesia Postprocedure Evaluation (Signed)
Anesthesia Post Note  Patient: Chino Sardo  Procedure(s) Performed: OSTEOCHONDROMA EXCISION VS ARTHROSCOPY ANKLE DEBRIDEMENT WITH OPEN OSTEOCHONDRAL AUTOGRAFT (Right Ankle)  Patient location during evaluation: PACU Anesthesia Type: General Level of consciousness: awake and alert Pain management: pain level controlled Vital Signs Assessment: post-procedure vital signs reviewed and stable Respiratory status: spontaneous breathing, nonlabored ventilation, respiratory function stable and patient connected to nasal cannula oxygen Cardiovascular status: blood pressure returned to baseline and stable Postop Assessment: no apparent nausea or vomiting Anesthetic complications: no   No complications documented.   Last Vitals:  Vitals:   08/30/20 1639 08/30/20 1726  BP: (!) 145/74 (!) 157/92  Pulse: 86 94  Resp: 16 18  Temp: (!) 36.4 C   SpO2: 99% 100%    Last Pain:  Vitals:   08/30/20 1726  TempSrc:   PainSc: 0-No pain                 Cleda Mccreedy Danalee Flath

## 2020-09-01 ENCOUNTER — Encounter: Payer: Self-pay | Admitting: Podiatry

## 2020-09-06 ENCOUNTER — Ambulatory Visit (INDEPENDENT_AMBULATORY_CARE_PROVIDER_SITE_OTHER): Payer: Medicaid Other | Admitting: Podiatry

## 2020-09-06 ENCOUNTER — Encounter: Payer: Medicaid Other | Admitting: Podiatry

## 2020-09-06 ENCOUNTER — Other Ambulatory Visit: Payer: Self-pay

## 2020-09-06 ENCOUNTER — Encounter: Payer: Self-pay | Admitting: Podiatry

## 2020-09-06 ENCOUNTER — Ambulatory Visit (INDEPENDENT_AMBULATORY_CARE_PROVIDER_SITE_OTHER): Payer: Medicaid Other

## 2020-09-06 DIAGNOSIS — S93401A Sprain of unspecified ligament of right ankle, initial encounter: Secondary | ICD-10-CM | POA: Diagnosis not present

## 2020-09-06 DIAGNOSIS — Z9889 Other specified postprocedural states: Secondary | ICD-10-CM | POA: Diagnosis not present

## 2020-09-06 DIAGNOSIS — M949 Disorder of cartilage, unspecified: Secondary | ICD-10-CM | POA: Diagnosis not present

## 2020-09-06 DIAGNOSIS — M899 Disorder of bone, unspecified: Secondary | ICD-10-CM

## 2020-09-06 DIAGNOSIS — M659 Synovitis and tenosynovitis, unspecified: Secondary | ICD-10-CM

## 2020-09-06 NOTE — Progress Notes (Signed)
-    Subjective:  Patient ID: Christopher Mccann, male    DOB: [birth year],  MRN: [id number]  Chief Complaint  Patient presents with  . Routine Post Op    PT stated that he is doing okay he has no concerns at this time    DOS: 08/30/2020 Procedure: Right ankle arthroscopy with debridement, open repair of osteochondral lesion with autograft from right calcaneus and bio cartilage  20 y.o. male returns for post-op check.  Doing well, he has minimal pain.  He has been nonweightbearing in the cast.  Review of Systems: Negative except as noted in the HPI. Denies N/V/F/Ch.   Objective:  There were no vitals filed for this visit. There is no height or weight on file to calculate BMI. Constitutional Well developed. Well nourished.  Vascular Foot warm and well perfused. Capillary refill normal to all digits.   Neurologic Normal speech. Oriented to person, place, and time. Epicritic sensation to light touch grossly present bilaterally.  Dermatologic Skin healing well without signs of infection. Skin edges well coapted without signs of infection.  Orthopedic: Tenderness to palpation noted about the surgical site.   Radiographs: No acute osseous abnormalities noted Assessment:   1. Osteochondral talar dome lesion   2. Sprain of right ankle, unspecified ligament, initial encounter   3. Synovitis of right ankle   4. Post-operative state    Plan:  Patient was evaluated and treated and all questions answered.  S/p foot surgery right -Progressing as expected post-operatively. -XR: As expected and read above -WB Status: NWB in short leg cast, this was reapplied today -Sutures: We will remove next week. -Medications: No refills required -Foot redressed.  Well-padded below-knee short leg cast was applied. -He will bring his CAM boot to next visit.  At this point we will remove his sutures and he may begin bathing.  We will remain NWB for a complete 6 weeks, begin nonweightbearing range of motion  exercises after next visit, physical therapy at 6 weeks -Reviewed intraoperative findings and photographs with the patient  No follow-ups on file.

## 2020-09-09 ENCOUNTER — Ambulatory Visit (HOSPITAL_BASED_OUTPATIENT_CLINIC_OR_DEPARTMENT_OTHER): Admit: 2020-09-09 | Payer: Medicaid Other | Admitting: Podiatry

## 2020-09-09 ENCOUNTER — Encounter (HOSPITAL_BASED_OUTPATIENT_CLINIC_OR_DEPARTMENT_OTHER): Payer: Self-pay

## 2020-09-09 SURGERY — EXCISION, OSTEOCHONDROMA
Anesthesia: General | Laterality: Right

## 2020-09-13 ENCOUNTER — Encounter: Payer: Medicaid Other | Admitting: Podiatry

## 2020-09-13 ENCOUNTER — Other Ambulatory Visit: Payer: Self-pay

## 2020-09-13 ENCOUNTER — Encounter: Payer: Self-pay | Admitting: Podiatry

## 2020-09-13 ENCOUNTER — Ambulatory Visit (INDEPENDENT_AMBULATORY_CARE_PROVIDER_SITE_OTHER): Payer: Medicaid Other | Admitting: Podiatry

## 2020-09-13 DIAGNOSIS — M899 Disorder of bone, unspecified: Secondary | ICD-10-CM

## 2020-09-13 DIAGNOSIS — M949 Disorder of cartilage, unspecified: Secondary | ICD-10-CM

## 2020-09-13 DIAGNOSIS — S93401A Sprain of unspecified ligament of right ankle, initial encounter: Secondary | ICD-10-CM

## 2020-09-13 DIAGNOSIS — M659 Synovitis and tenosynovitis, unspecified: Secondary | ICD-10-CM

## 2020-09-13 DIAGNOSIS — Z9889 Other specified postprocedural states: Secondary | ICD-10-CM

## 2020-09-13 NOTE — Progress Notes (Signed)
-    Subjective:  Patient ID: Christopher Mccann, male    DOB: 10-Jan-2001,  MRN: [id number]  Chief Complaint  Patient presents with  . Routine Post Op    Post op Pt stated that he is doing well he has no concerns or pain at this time     DOS: 08/30/2020 Procedure: Right ankle arthroscopy with debridement, open repair of osteochondral lesion with autograft from right calcaneus and bio cartilage  20 y.o. male returns for post-op check.  Doing well, he has minimal pain.  He has been nonweightbearing in the cast.  Review of Systems: Negative except as noted in the HPI. Denies N/V/F/Ch.   Objective:  There were no vitals filed for this visit. There is no height or weight on file to calculate BMI. Constitutional Well developed. Well nourished.  Vascular Foot warm and well perfused. Capillary refill normal to all digits.   Neurologic Normal speech. Oriented to person, place, and time. Epicritic sensation to light touch grossly present bilaterally.  Dermatologic Skin healing well without signs of infection. Skin edges well coapted without signs of infection.  Cast is clean dry and intact  Orthopedic: Tenderness to palpation noted about the surgical site.   Radiographs: No acute osseous abnormalities noted Assessment:   1. Osteochondral talar dome lesion   2. Sprain of right ankle, unspecified ligament, initial encounter   3. Synovitis of right ankle   4. Post-operative state    Plan:  Patient was evaluated and treated and all questions answered.  S/p foot surgery right -Lateral sutures removed, anteromedial ankle incision needs 1 more week for suture removal.  After removal at next visit will begin nonweightbearing range of motion exercises, and then in 3 weeks begin partial weightbearing as well as physical therapy  Return in about 1 week (around 09/20/2020) for final suture removal.

## 2020-09-16 ENCOUNTER — Encounter: Payer: Medicaid Other | Admitting: Podiatry

## 2020-09-20 ENCOUNTER — Other Ambulatory Visit: Payer: Self-pay

## 2020-09-20 ENCOUNTER — Ambulatory Visit (INDEPENDENT_AMBULATORY_CARE_PROVIDER_SITE_OTHER): Payer: Medicaid Other | Admitting: Podiatry

## 2020-09-20 ENCOUNTER — Encounter: Payer: Self-pay | Admitting: Podiatry

## 2020-09-20 DIAGNOSIS — Z9889 Other specified postprocedural states: Secondary | ICD-10-CM

## 2020-09-20 DIAGNOSIS — M949 Disorder of cartilage, unspecified: Secondary | ICD-10-CM

## 2020-09-20 DIAGNOSIS — M659 Synovitis and tenosynovitis, unspecified: Secondary | ICD-10-CM

## 2020-09-20 DIAGNOSIS — M899 Disorder of bone, unspecified: Secondary | ICD-10-CM

## 2020-09-20 DIAGNOSIS — S93401A Sprain of unspecified ligament of right ankle, initial encounter: Secondary | ICD-10-CM

## 2020-09-20 NOTE — Patient Instructions (Signed)
No weight on the foot yet  Do range of motion exercises daily: up and down, side to side x10. Repeat for 3 sets. Then draw circles or ABCs with big toe. Do this 4 times a day.

## 2020-09-21 NOTE — Progress Notes (Signed)
-    Subjective:  Patient ID: Christopher Mccann, male    DOB: [birth year],  MRN: [id number]  Chief Complaint  Patient presents with  . Routine Post Op    PT stated that he is doing well     DOS: 08/30/2020 Procedure: Right ankle arthroscopy with debridement, open repair of osteochondral lesion with autograft from right calcaneus and bio cartilage  20 y.o. male returns for post-op check.  Doing well, he has minimal pain.  He has been nonweightbearing in the CAM boot.  Review of Systems: Negative except as noted in the HPI. Denies N/V/F/Ch.   Objective:  There were no vitals filed for this visit. There is no height or weight on file to calculate BMI. Constitutional Well developed. Well nourished.  Vascular Foot warm and well perfused. Capillary refill normal to all digits.   Neurologic Normal speech. Oriented to person, place, and time. Epicritic sensation to light touch grossly present bilaterally.  Dermatologic Skin healing well without signs of infection. Skin edges well coapted without signs of infection.   Orthopedic: Tenderness to palpation noted about the surgical site.   Radiographs: No acute osseous abnormalities noted Assessment:   1. Osteochondral talar dome lesion   2. Sprain of right ankle, unspecified ligament, initial encounter   3. Synovitis of right ankle   4. Post-operative state    Plan:  Patient was evaluated and treated and all questions answered.  S/p foot surgery right -sutures removed today -May begin bathing, no soaking -begin NWB ROM exercises -Return in 3 weeks and will begin WB and PT  -He inquired about ingrown toenail removal and we will plan to do this next visit  Return in about 3 weeks (around 10/11/2020) for begin WB in boot, remove ingrown toenails.

## 2020-09-23 ENCOUNTER — Encounter: Payer: Medicaid Other | Admitting: Podiatry

## 2020-09-27 ENCOUNTER — Encounter: Payer: Medicaid Other | Admitting: Podiatry

## 2020-09-30 DIAGNOSIS — Z419 Encounter for procedure for purposes other than remedying health state, unspecified: Secondary | ICD-10-CM | POA: Diagnosis not present

## 2020-10-07 ENCOUNTER — Encounter: Payer: Medicaid Other | Admitting: Podiatry

## 2020-10-11 ENCOUNTER — Other Ambulatory Visit: Payer: Self-pay

## 2020-10-11 ENCOUNTER — Ambulatory Visit (INDEPENDENT_AMBULATORY_CARE_PROVIDER_SITE_OTHER): Payer: Medicaid Other | Admitting: Podiatry

## 2020-10-11 DIAGNOSIS — M659 Synovitis and tenosynovitis, unspecified: Secondary | ICD-10-CM

## 2020-10-11 DIAGNOSIS — L600 Ingrowing nail: Secondary | ICD-10-CM

## 2020-10-11 DIAGNOSIS — M949 Disorder of cartilage, unspecified: Secondary | ICD-10-CM

## 2020-10-11 DIAGNOSIS — S93401A Sprain of unspecified ligament of right ankle, initial encounter: Secondary | ICD-10-CM

## 2020-10-11 DIAGNOSIS — M899 Disorder of bone, unspecified: Secondary | ICD-10-CM

## 2020-10-11 NOTE — Patient Instructions (Signed)

## 2020-10-12 ENCOUNTER — Encounter: Payer: Self-pay | Admitting: Podiatry

## 2020-10-12 NOTE — Progress Notes (Signed)
-    Subjective:  Patient ID: Christopher Mccann, male    DOB: [birth year],  MRN: [id number]  Chief Complaint  Patient presents with  . Routine Post Op    begin WB in boot, remove ingrown toenails POV #3 DOS 08/30/2020 RT ANKLE ARTHROSCOPY, POSS REPAIR OF TALAR DOME LESION W/BONE GRAFT, CARTILAGE GRAFT, POSS OSTEOTOMY OF ANKLE BONE, POSS BONE GRAFT HARVEST FROM HEEL  . Ingrown Toenail    DOS: 08/30/2020 Procedure: Right ankle arthroscopy with debridement, open repair of osteochondral lesion with autograft from right calcaneus and bio cartilage  20 y.o. male returns for post-op check.  Doing well, he has minimal pain.  He has been nonweightbearing in the CAM boot.  Would like his ingrown toenail removed today  Review of Systems: Negative except as noted in the HPI. Denies N/V/F/Ch.   Objective:  There were no vitals filed for this visit. There is no height or weight on file to calculate BMI. Constitutional Well developed. Well nourished.  Vascular Foot warm and well perfused. Capillary refill normal to all digits.   Neurologic Normal speech. Oriented to person, place, and time. Epicritic sensation to light touch grossly present bilaterally.  Dermatologic  well-healed surgical incisions.  Ingrowing lateral hallux nail without paronychia  Orthopedic:  Minimal tenderness.  Good range of motion, pain-free   Radiographs: No acute osseous abnormalities noted Assessment:   1. Ingrowing right great toenail   2. Osteochondral talar dome lesion   3. Sprain of right ankle, unspecified ligament, initial encounter   4. Synovitis of right ankle    Plan:  Patient was evaluated and treated and all questions answered.  S/p ankle surgery right -His ankle is doing quite well now 6 weeks out.  He may begin protected weightbearing in the CAM boot.  Begin physical therapy.  No impact activity until 6 months    Ingrown Nail, right -Patient elects to proceed with minor surgery to remove ingrown toenail  today. Consent reviewed and signed by patient. -Ingrown nail excised. See procedure note. -Educated on post-procedure care including soaking. Written instructions provided and reviewed. -Patient to follow up in 2 weeks for nail check.  Procedure: Excision of Ingrown Toenail Location: Right 1st toe lateral nail borders. Anesthesia: Lidocaine 1% plain; 1.5 mL and Marcaine 0.5% plain; 1.5 mL, digital block. Skin Prep: Betadine. Dressing: Silvadene; telfa; dry, sterile, compression dressing. Technique: Following skin prep, the toe was exsanguinated and a tourniquet was secured at the base of the toe. The affected nail border was freed, split with a nail splitter, and excised. Chemical matrixectomy was then performed with phenol and irrigated out with alcohol. The tourniquet was then removed and sterile dressing applied. Disposition: Patient tolerated procedure well. Patient to return in 2 weeks for follow-up.     Return in about 1 month (around 11/10/2020).

## 2020-10-13 ENCOUNTER — Other Ambulatory Visit: Payer: Self-pay

## 2020-10-13 ENCOUNTER — Ambulatory Visit: Payer: Medicaid Other | Attending: Podiatry

## 2020-10-13 DIAGNOSIS — S93401A Sprain of unspecified ligament of right ankle, initial encounter: Secondary | ICD-10-CM | POA: Insufficient documentation

## 2020-10-13 DIAGNOSIS — M899 Disorder of bone, unspecified: Secondary | ICD-10-CM | POA: Insufficient documentation

## 2020-10-13 DIAGNOSIS — M949 Disorder of cartilage, unspecified: Secondary | ICD-10-CM | POA: Insufficient documentation

## 2020-10-13 DIAGNOSIS — R262 Difficulty in walking, not elsewhere classified: Secondary | ICD-10-CM | POA: Insufficient documentation

## 2020-10-13 DIAGNOSIS — M659 Synovitis and tenosynovitis, unspecified: Secondary | ICD-10-CM | POA: Insufficient documentation

## 2020-10-13 DIAGNOSIS — R600 Localized edema: Secondary | ICD-10-CM | POA: Diagnosis not present

## 2020-10-14 NOTE — Patient Instructions (Signed)
    AROM exs-ankle pumps, circles, alphabet
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
## 2020-10-18 ENCOUNTER — Telehealth: Payer: Self-pay | Admitting: *Deleted

## 2020-10-18 NOTE — Telephone Encounter (Signed)
Patient is calling about whether to continue to use his crutches when putting pressure on his foot. When can he stop using his crutches completely

## 2020-10-18 NOTE — Telephone Encounter (Signed)
He no longer needs them

## 2020-10-19 NOTE — Telephone Encounter (Signed)
Returned call to patient, no answer, could not leave a Vmessage, will try later.

## 2020-10-19 NOTE — Telephone Encounter (Signed)
Patient called into the office and I did let him know that Dr. Lilian Kapur stated that he no longer needs crutches.

## 2020-10-19 NOTE — Telephone Encounter (Signed)
Called and gave information to patient per Dr Vara Guardian note,verbalized understanding.

## 2020-10-28 ENCOUNTER — Other Ambulatory Visit: Payer: Self-pay

## 2020-10-28 ENCOUNTER — Ambulatory Visit: Payer: Medicaid Other

## 2020-10-28 DIAGNOSIS — R600 Localized edema: Secondary | ICD-10-CM

## 2020-10-28 DIAGNOSIS — M949 Disorder of cartilage, unspecified: Secondary | ICD-10-CM | POA: Diagnosis not present

## 2020-10-28 DIAGNOSIS — R262 Difficulty in walking, not elsewhere classified: Secondary | ICD-10-CM | POA: Diagnosis not present

## 2020-10-28 DIAGNOSIS — M659 Synovitis and tenosynovitis, unspecified: Secondary | ICD-10-CM

## 2020-10-28 DIAGNOSIS — M899 Disorder of bone, unspecified: Secondary | ICD-10-CM | POA: Diagnosis not present

## 2020-10-28 DIAGNOSIS — S93401A Sprain of unspecified ligament of right ankle, initial encounter: Secondary | ICD-10-CM

## 2020-10-28 NOTE — Therapy (Signed)
[facility] Outpatient Rehabilitation [facility] [address] [place], [place], [address] Phone: [phone]   Fax:  [phone]  Physical Therapy Treatment  Patient Details  Name: Christopher Mccann MRN: [id number] Date of Birth: 2001-04-20 Referring Provider (PT): Edwin Cap, [place]   Encounter Date: 10/28/2020   PT End of Session - 10/28/20 0916    Visit Number 2    Number of Visits 4    Date for PT Re-Evaluation 11/26/20    Authorization Type [place] MEDICAID [facility] 12 visits; 09/2920-12/27/20    Authorization Time Period 12 visits; 09/2920-12/27/20    Authorization - Visit Number 1    Authorization - Number of Visits 12    Progress Note Due on Visit 10    PT Start Time 0915    PT Stop Time 1000    PT Time Calculation (min) 45 min    Equipment Utilized During Treatment Other (comment)   cam boot   Activity Tolerance Patient tolerated treatment well    Behavior During Therapy [facility] for tasks assessed/performed           Past Medical History:  Diagnosis Date  . Ankle pain, right   . Depression     Past Surgical History:  Procedure Laterality Date  . CAUTERIZE INNER NOSE    . OSTEOCHONDROMA EXCISION Right 08/30/2020   Procedure: OSTEOCHONDROMA EXCISION VS ARTHROSCOPY ANKLE DEBRIDEMENT WITH OPEN OSTEOCHONDRAL AUTOGRAFT;  Surgeon: Edwin Cap, DPM;  Location: [facility] ORS;  Service: Podiatry;  Laterality: Right;  BLOCK    There were no vitals filed for this visit.   Subjective Assessment - 10/28/20 0921    Subjective Pt reports he is doing well. He stopped using the crutches last week and is to come out of the cam boot on 11/10/20 per conversation c Dr. Vara Guardian office    Limitations Walking    Patient Stated Goals To get back to where is was before. To play football.    Currently in Pain? No/denies    Pain Score 4     Pain Location Ankle    Pain Orientation Right   deep in the ankle   Pain Descriptors / Indicators Aching;Throbbing    Pain Type Chronic pain     Pain Onset Other (comment)    Pain Frequency Intermittent    Aggravating Factors  Prolonged satnding ot walking for 2 to 3 hours              [facility] PT Assessment - 10/28/20 0001      AROM   Right Ankle Dorsiflexion 16                         [facility] Adult PT Treatment/Exercise - 10/28/20 0001      Ambulation/Gait   Ambulation/Gait Assistance 7: Independent    Assistive device --   cam boot   Gait Pattern Step-to pattern      Exercises   Exercises Ankle      Ankle Exercises: Seated   ABC's 1 rep    Towel Crunch --   20 rreps   Towel Inversion/Eversion --   20 reps   Heel Raises Left;20 reps    Toe Raise 20 reps   Left   Heel Slides Left;10 reps   10 sec stretch in DF                 PT Education - 10/28/20 1011    Education Details HEP updated  Person(s) Educated Patient    Methods Explanation;Demonstration;Tactile cues;Verbal cues;Handout    Comprehension Verbalized understanding;Returned demonstration;Verbal cues required;Tactile cues required            PT Short Term Goals - 10/15/20 0028      PT SHORT TERM GOAL #1   Title Pt will be Ind in an initial HEP    Status New    Target Date 11/05/20      PT SHORT TERM GOAL #2   Title Pt will voice undestanding of measures to assist with pain and swelling management.    Status New    Target Date 11/05/20             PT Long Term Goals - 10/15/20 0030      PT LONG TERM GOAL #1   Title Increase R ankle PF to 50d for appropropriate toe off with terminal stance.    Status New    Target Date 11/26/20      PT LONG TERM GOAL #2   Title The R ankle will demonstrate 5/5 strength for appropriate stability    Status New    Target Date 11/26/20      PT LONG TERM GOAL #3   Title Pt will walk with a normalized gait pattern with no to mild antalgic gait pattern    Status New    Target Date 11/26/20      PT LONG TERM GOAL #4   Title Pt will be able to asc/dsc 12 steps c a step over  step gait pattern and without the use of a handrail    Status New    Target Date 11/26/20                 Plan - 10/28/20 0917    Clinical Impression Statement Pt presents with improved R ankle DF AROM from 8 to 16 d. Pt reports he is consistently completing his HEP. PT was provided for AROM, PROM, and strengthening for the R ankle. HEP was updated. Pt participated with good effort and tolerated the PT session without adverse effects.    Personal Factors and Comorbidities Past/Current Experience;Time since onset of injury/illness/exacerbation;Comorbidity 1    Comorbidities obesity    Examination-Activity Limitations Locomotion Level;Squat;Stairs;Stand    Stability/Clinical Decision Making Stable/Uncomplicated    Clinical Decision Making Low    Rehab Potential Good    PT Frequency --   1x per every 2 weeks for 3 visits   PT Treatment/Interventions Cryotherapy;Electrical Stimulation;Iontophoresis 4mg /ml Dexamethasone;Moist Heat;Ultrasound;Balance training;Therapeutic exercise;Therapeutic activities;Functional mobility training;Stair training;Gait training;Patient/family education;Manual techniques;Dry needling;Passive range of motion;Taping;Vasopneumatic Device;Joint Manipulations    PT Next Visit Plan Progress pt to WB exs as tolerated. Assess response to HEP. Progress ther ex.  Use of modalities and manual techniques as indicated.    PT Home Exercise Plan    Consulted and Agree with Plan of Care Patient           Patient will benefit from skilled therapeutic intervention in order to improve the following deficits and impairments:  Difficulty walking,Abnormal gait,Decreased range of motion,Decreased strength,Increased edema,Decreased balance,Decreased activity tolerance,Obesity  Visit Diagnosis: Synovitis of right ankle  Osteochondral talar dome lesion  Localized edema  Difficulty in walking, not elsewhere classified  Sprain of right ankle, unspecified ligament,  initial encounter     Problem List Patient Active Problem List   Diagnosis Date Noted  . Osteochondral defect of talus   . Synovitis of right ankle   . Bone cyst of ankle  Joellyn Rued MS, PT 10/28/20 10:16 AM  [facility] [address] [place], [place], [address] Phone: [phone]   Fax:  [phone]  Name: Andriel Omalley MRN: [id number] Date of Birth: March 14, 2001

## 2020-10-30 DIAGNOSIS — Z419 Encounter for procedure for purposes other than remedying health state, unspecified: Secondary | ICD-10-CM | POA: Diagnosis not present

## 2020-11-07 ENCOUNTER — Telehealth: Payer: Self-pay | Admitting: Podiatry

## 2020-11-07 NOTE — Telephone Encounter (Signed)
Patient called regarding brace, stated he no longer has it and not sure if Dr.McDonald needed to be aware before appointment since he suppose to start back wearing it after appointment, Please Advise

## 2020-11-08 ENCOUNTER — Telehealth (INDEPENDENT_AMBULATORY_CARE_PROVIDER_SITE_OTHER): Payer: Medicaid Other | Admitting: Podiatry

## 2020-11-08 NOTE — Telephone Encounter (Signed)
Spoke to patient by phone and discussed with him that I am unfortunately going to have to move his appointment this week as I am out of the office for COVID quarantine.  He is 10 weeks out from surgery at this point.  He can go back to work part-time for the next 2 weeks and then transition to full-time after that.  He can also transition from the CAM boot to a supportive ankle brace such as an ASO stabilizer which he will get on [employer] and wear this with a supportive sneaker or work IT consultant.  I will see him in 2 weeks and reevaluate should be okay to release to full work duties at that point.  I did discuss with him that we will have to avoid impactful exercise or activities for a total of 6 months until March 02, 2021.  Sharl Ma, DPM 11/08/2020

## 2020-11-09 ENCOUNTER — Ambulatory Visit: Payer: Medicaid Other | Attending: Podiatry

## 2020-11-09 ENCOUNTER — Other Ambulatory Visit: Payer: Self-pay

## 2020-11-09 DIAGNOSIS — M899 Disorder of bone, unspecified: Secondary | ICD-10-CM

## 2020-11-09 DIAGNOSIS — M949 Disorder of cartilage, unspecified: Secondary | ICD-10-CM | POA: Insufficient documentation

## 2020-11-09 DIAGNOSIS — M25371 Other instability, right ankle: Secondary | ICD-10-CM | POA: Diagnosis not present

## 2020-11-09 DIAGNOSIS — R262 Difficulty in walking, not elsewhere classified: Secondary | ICD-10-CM

## 2020-11-09 DIAGNOSIS — R600 Localized edema: Secondary | ICD-10-CM

## 2020-11-09 DIAGNOSIS — S93401A Sprain of unspecified ligament of right ankle, initial encounter: Secondary | ICD-10-CM | POA: Diagnosis not present

## 2020-11-09 DIAGNOSIS — M659 Synovitis and tenosynovitis, unspecified: Secondary | ICD-10-CM | POA: Insufficient documentation

## 2020-11-09 DIAGNOSIS — M25571 Pain in right ankle and joints of right foot: Secondary | ICD-10-CM

## 2020-11-09 DIAGNOSIS — M65971 Unspecified synovitis and tenosynovitis, right ankle and foot: Secondary | ICD-10-CM

## 2020-11-09 NOTE — Therapy (Signed)
[facility] Outpatient Rehabilitation [facility] [address] [place], [place], [address] Phone: [phone]   Fax:  [phone]  Physical Therapy Treatment  Patient Details  Name: Christopher Mccann MRN: [id number] Date of Birth: [birth year] Referring Provider (PT): Edwin Cap, [place]   Encounter Date: 11/09/2020   PT End of Session - 11/09/20 1146    Visit Number 3    Number of Visits 12    Date for PT Re-Evaluation 11/26/20    Authorization Type [place] MEDICAID [facility] 12 visits; 09/2920-12/27/20    Authorization Time Period 12 visits; 09/2920-12/27/20    Authorization - Visit Number 2    Authorization - Number of Visits 12    Progress Note Due on Visit 10    PT Start Time 1134    PT Stop Time 1218    PT Time Calculation (min) 44 min    Equipment Utilized During Treatment Other (comment)   ASO ankle brace   Activity Tolerance Patient tolerated treatment well    Behavior During Therapy [facility] for tasks assessed/performed           Past Medical History:  Diagnosis Date  . Ankle pain, right   . Depression     Past Surgical History:  Procedure Laterality Date  . CAUTERIZE INNER NOSE    . OSTEOCHONDROMA EXCISION Right 08/30/2020   Procedure: OSTEOCHONDROMA EXCISION VS ARTHROSCOPY ANKLE DEBRIDEMENT WITH OPEN OSTEOCHONDRAL AUTOGRAFT;  Surgeon: Edwin Cap, DPM;  Location: [facility] ORS;  Service: Podiatry;  Laterality: Right;  BLOCK    There were no vitals filed for this visit.   Subjective Assessment - 11/09/20 1141    Subjective Pt is OK to be out of the cam boot. Pt reports intermittent low level, lateral R ankle discomfort which is different than his presurgical pain.    Patient Stated Goals To get back to where is was before. To play football.    Currently in Pain? No/denies    Pain Score 0-No pain   1/10 lateral ankle intermittent   Pain Location Ankle    Pain Orientation Right    Pain Descriptors / Indicators Sore    Pain Type Chronic pain    Pain Onset More  than a month ago    Pain Frequency Intermittent                             [facility] Adult PT Treatment/Exercise - 11/09/20 0001      Exercises   Exercises Ankle      Ankle Exercises: Stretches   Soleus Stretch 1 rep;30 seconds    Gastroc Stretch 1 rep;30 seconds    Slant Board Stretch 1 rep;30 seconds      Ankle Exercises: Aerobic   Stationary Bike 5 mins; L4      Ankle Exercises: Supine   T-Band 4 way ankle c blue Tband; 15x2               Balance Exercises - 11/09/20 0001      Balance Exercises: Standing   Tandem Stance Eyes open;Foam/compliant surface;4 reps   each c intermittent hand assist as needed   SLS Eyes open;Foam/compliant surface;Solid surface;4 reps;30 secs   with intermittent hand assist as needed              PT Short Term Goals - 11/09/20 1259      PT SHORT TERM GOAL #1   Title Pt will be Ind in an initial  HEP    Status Achieved    Target Date 11/09/20      PT SHORT TERM GOAL #2   Title Pt will voice undestanding of measures to assist with pain and swelling management.    Status Achieved    Target Date 11/09/20             PT Long Term Goals - 10/15/20 0030      PT LONG TERM GOAL #1   Title Increase R ankle PF to 50d for appropropriate toe off with terminal stance.    Status New    Target Date 11/26/20      PT LONG TERM GOAL #2   Title The R ankle will demonstrate 5/5 strength for appropriate stability    Status New    Target Date 11/26/20      PT LONG TERM GOAL #3   Title Pt will walk with a normalized gait pattern with no to mild antalgic gait pattern    Status New    Target Date 11/26/20      PT LONG TERM GOAL #4   Title Pt will be able to asc/dsc 12 steps c a step over step gait pattern and without the use of a handrail    Status New    Target Date 11/26/20                 Plan - 11/09/20 1153    Clinical Impression Statement Pt presents to PT out of the cam boot and wearing a ASO ankle  brace per Dr. Lilian Kapur. Additionally, he has been released to work part-time for 2 weeks then start full time. Pt is not to start impact activities until 6 months post surgery. Pt's PT  for his R ankle was progressed today starting balance/ankle stability exs in standing on both solid and compliant surfaces. Tband exs for ankle strength were increased to a greater resistance with the Blue Tband. Pt tolerated today's session without adverse effects. Pt will continue to benefit from PT for ROM, strengthening, and balance to optimize functional use of the R ankle/LE.    Personal Factors and Comorbidities Past/Current Experience;Time since onset of injury/illness/exacerbation;Comorbidity 1    Comorbidities obesity    Examination-Activity Limitations Locomotion Level;Squat;Stairs;Stand    Stability/Clinical Decision Making Stable/Uncomplicated    Clinical Decision Making Low    Rehab Potential Good    PT Frequency 2x / week   1x per every 2 weeks for 3 visits   PT Duration 4 weeks    PT Treatment/Interventions Cryotherapy;Electrical Stimulation;Iontophoresis 4mg /ml Dexamethasone;Moist Heat;Ultrasound;Balance training;Therapeutic exercise;Therapeutic activities;Functional mobility training;Stair training;Gait training;Patient/family education;Manual techniques;Dry needling;Passive range of motion;Taping;Vasopneumatic Device;Joint Manipulations    PT Next Visit Plan Supervised PT out of the ankle brace as tolerated. Pt to begin PT 2w4 starting the week of 11/28/20.    PT Home Exercise Plan 11/30/20    Consulted and Agree with Plan of Care Patient           Patient will benefit from skilled therapeutic intervention in order to improve the following deficits and impairments:  Difficulty walking,Abnormal gait,Decreased range of motion,Decreased strength,Increased edema,Decreased balance,Decreased activity tolerance,Obesity  Visit Diagnosis: Synovitis of right ankle  Osteochondral talar dome  lesion  Localized edema  Difficulty in walking, not elsewhere classified  Sprain of right ankle, unspecified ligament, initial encounter  Unstable right ankle  Pain in right ankle and joints of right foot     Problem List Patient Active Problem List   Diagnosis Date Noted  .  Osteochondral defect of talus   . Synovitis of right ankle   . Bone cyst of ankle    Joellyn Rued MS, PT 11/09/20 1:19 PM  [facility] [address] [place], [place], [address] Phone: [phone]   Fax:  [phone]  Name: Christopher Mccann MRN: [id number] Date of Birth: 11-Aug-2000

## 2020-11-10 ENCOUNTER — Encounter: Payer: Medicaid Other | Admitting: Podiatry

## 2020-11-14 ENCOUNTER — Telehealth: Payer: Self-pay | Admitting: Podiatry

## 2020-11-14 NOTE — Telephone Encounter (Signed)
Patient calling to request a back to work note. Patient states he can not go back to work until he has it writing from the provider. Patient would like to come pick up the note 5/17 in the morning.

## 2020-11-15 ENCOUNTER — Encounter: Payer: Self-pay | Admitting: Podiatry

## 2020-11-15 NOTE — Telephone Encounter (Signed)
Thank you :)

## 2020-11-21 ENCOUNTER — Encounter: Payer: Self-pay | Admitting: Podiatry

## 2020-11-21 ENCOUNTER — Ambulatory Visit (INDEPENDENT_AMBULATORY_CARE_PROVIDER_SITE_OTHER): Payer: Medicaid Other | Admitting: Podiatry

## 2020-11-21 ENCOUNTER — Other Ambulatory Visit: Payer: Self-pay

## 2020-11-21 DIAGNOSIS — M899 Disorder of bone, unspecified: Secondary | ICD-10-CM

## 2020-11-21 DIAGNOSIS — L600 Ingrowing nail: Secondary | ICD-10-CM | POA: Diagnosis not present

## 2020-11-21 DIAGNOSIS — S93401A Sprain of unspecified ligament of right ankle, initial encounter: Secondary | ICD-10-CM

## 2020-11-21 DIAGNOSIS — M659 Synovitis and tenosynovitis, unspecified: Secondary | ICD-10-CM

## 2020-11-21 NOTE — Progress Notes (Signed)
-    Subjective:  Patient ID: Christopher Mccann, male    DOB: 09-22-2000,  MRN: [id number]  Chief Complaint  Patient presents with  . Routine Post Op      1 month fu  POV #4 DOS 08/30/2020 RT ANKLE ARTHROSCOPY, POSS REPAIR OF TALAR DOME LESION W/BONE GRAFT, CARTILAGE GRAFT, POSS OSTEOTOMY OF ANKLE BONE, POSS BONE GRAFT HARVEST FROM HEEL  . Ingrown Toenail    Nail check right    DOS: 08/30/2020 Procedure: Right ankle arthroscopy with debridement, open repair of osteochondral lesion with autograft from right calcaneus and bio cartilage  20 y.o. male returns for post-op check.  He is doing very well he is back at work part-time only wearing the Tri-Lock ankle brace now.  Toenail has been healing well  Review of Systems: Negative except as noted in the HPI. Denies N/V/F/Ch.   Objective:  There were no vitals filed for this visit. There is no height or weight on file to calculate BMI. Constitutional Well developed. Well nourished.  Vascular Foot warm and well perfused. Capillary refill normal to all digits.   Neurologic Normal speech. Oriented to person, place, and time. Epicritic sensation to light touch grossly present bilaterally.  Dermatologic  well-healed surgical incisions.  Matricectomy site is well-healed  Orthopedic:  Minimal tenderness.  Good range of motion, pain-free   Radiographs: No acute osseous abnormalities noted Assessment:   1. Osteochondral talar dome lesion   2. Sprain of right ankle, unspecified ligament, initial encounter   3. Ingrowing right great toenail   4. Synovitis of right ankle    Plan:  Patient was evaluated and treated and all questions answered.  S/p ankle surgery right -He is doing very well with his ankles now nearly 3 months postop.  He can transition out of the Tri-Lock ankle brace and begin to increase his activity level and return to a full work schedule over the next couple weeks.  Still plan to avoid any impact for 6 months after surgery until  early September.  Discontinue PT as soon as he is ready    Ingrown Nail, right -Can leave open to air at this point.     Return in about 8 weeks (around 01/16/2021).

## 2020-11-22 ENCOUNTER — Telehealth: Payer: Self-pay | Admitting: *Deleted

## 2020-11-22 NOTE — Telephone Encounter (Signed)
Lauren(employer) is wanting clarification on a letter of restrictions sent 11/15/20 for patient. She would like to know how long will this be in place, how often and how long does he need to sit,to reduce the hours to benefit his medical condition.Please call [phone].

## 2020-11-24 ENCOUNTER — Encounter: Payer: Self-pay | Admitting: Podiatry

## 2020-11-24 ENCOUNTER — Other Ambulatory Visit: Payer: Self-pay

## 2020-11-24 ENCOUNTER — Telehealth: Payer: Self-pay | Admitting: Podiatry

## 2020-11-24 ENCOUNTER — Ambulatory Visit: Payer: Medicaid Other

## 2020-11-24 DIAGNOSIS — M25571 Pain in right ankle and joints of right foot: Secondary | ICD-10-CM | POA: Diagnosis not present

## 2020-11-24 DIAGNOSIS — M659 Synovitis and tenosynovitis, unspecified: Secondary | ICD-10-CM

## 2020-11-24 DIAGNOSIS — R600 Localized edema: Secondary | ICD-10-CM

## 2020-11-24 DIAGNOSIS — M25371 Other instability, right ankle: Secondary | ICD-10-CM | POA: Diagnosis not present

## 2020-11-24 DIAGNOSIS — M949 Disorder of cartilage, unspecified: Secondary | ICD-10-CM | POA: Diagnosis not present

## 2020-11-24 DIAGNOSIS — S93401A Sprain of unspecified ligament of right ankle, initial encounter: Secondary | ICD-10-CM

## 2020-11-24 DIAGNOSIS — M899 Disorder of bone, unspecified: Secondary | ICD-10-CM | POA: Diagnosis not present

## 2020-11-24 DIAGNOSIS — R262 Difficulty in walking, not elsewhere classified: Secondary | ICD-10-CM | POA: Diagnosis not present

## 2020-11-24 NOTE — Therapy (Addendum)
[facility] Outpatient Rehabilitation [facility] [address] [place], [place], [address] Phone: [phone]   Fax:  [phone]  Physical Therapy Treatment  Patient Details  Name: Christopher Mccann MRN: [id number] Date of Birth: 2000-11-05 Referring Provider (PT): Edwin Cap, [place]   Encounter Date: 11/24/2020   PT End of Session - 11/24/20 1530    Visit Number 4    Number of Visits 12    Date for PT Re-Evaluation 11/26/20    Authorization Type [place] MEDICAID [facility] 12 visits; 09/2920-12/27/20    Authorization Time Period 12 visits; 09/2920-12/27/20    Authorization - Visit Number 3    Authorization - Number of Visits 12    Progress Note Due on Visit 10    PT Start Time 1148    PT Stop Time 1234    PT Time Calculation (min) 46 min    Equipment Utilized During Treatment Other (comment)    Activity Tolerance Patient tolerated treatment well    Behavior During Therapy [facility] for tasks assessed/performed           Past Medical History:  Diagnosis Date  . Ankle pain, right   . Depression     Past Surgical History:  Procedure Laterality Date  . CAUTERIZE INNER NOSE    . OSTEOCHONDROMA EXCISION Right 08/30/2020   Procedure: OSTEOCHONDROMA EXCISION VS ARTHROSCOPY ANKLE DEBRIDEMENT WITH OPEN OSTEOCHONDRAL AUTOGRAFT;  Surgeon: Edwin Cap, DPM;  Location: [facility] ORS;  Service: Podiatry;  Laterality: Right;  BLOCK    There were no vitals filed for this visit.   Subjective Assessment - 11/24/20 1200    Subjective Pt reports he is not having R ankle pain. He is to wean himself out of his brace for over the next week as tolerated, and gradually increase work to 9 hour days, 3 days a week to 5 days a week. Pt reports he is to not start running until Sept    Limitations Walking    Patient Stated Goals To get back to where is was before. To play football.    Currently in Pain? No/denies    Pain Location Ankle    Pain Orientation Right    Pain Type Chronic pain    Pain  Onset More than a month ago    Pain Frequency Rarely              [facility] PT Assessment - 11/24/20 0001      AROM   Right Ankle Dorsiflexion 16    Right Ankle Plantar Flexion 50      High Level Balance   High Level Balance Comments SL stance L 60+ sec; R 25 sec ( 20, 30 sec)                         [facility] Adult PT Treatment/Exercise - 11/24/20 0001      Exercises   Exercises Ankle      Ankle Exercises: Stretches   Soleus Stretch 1 rep;30 seconds    Gastroc Stretch 1 rep;30 seconds      Ankle Exercises: Aerobic   Stationary Bike 5 mins; L4      Ankle Exercises: Standing   Heel Raises Right;Left;20 reps    Toe Raise 20 reps    Other Standing Ankle Exercises Forward step ups; R; 15x2. lateral step ups;15x2 both on the 6" box    Other Standing Ankle Exercises R SL forward reach to counter; 15x2  Balance Exercises - 11/24/20 0001      Balance Exercises: Standing   SLS Eyes open;Solid surface;Foam/compliant surface;3 reps;30 secs;Eyes closed               PT Short Term Goals - 11/09/20 1259      PT SHORT TERM GOAL #1   Title Pt will be Ind in an initial HEP    Status Achieved    Target Date 11/09/20      PT SHORT TERM GOAL #2   Title Pt will voice undestanding of measures to assist with pain and swelling management.    Status Achieved    Target Date 11/09/20             PT Long Term Goals - 11/24/20 1317      PT LONG TERM GOAL #1   Title Increase R ankle PF to 50d for appropropriate toe off with terminal stance. 11/24/20- R ankle ROM = 16-50d    Status Achieved    Target Date 11/24/20      PT LONG TERM GOAL #2   Title The R ankle will demonstrate 5/5 strength for appropriate stability    Baseline see objective-grossly 4 to 4+/5    Status On-going      PT LONG TERM GOAL #3   Title Pt will walk with a normalized gait pattern with no to mild antalgic gait pattern    Status Achieved      PT LONG TERM GOAL #4    Title Pt will be able to asc/dsc 12 steps c a step over step gait pattern and without the use of a handrail    Status On-going    Target Date 12/27/20      PT LONG TERM GOAL #5   Title Increase R SL standing time to 50 sec for improved R ankle stability and balance to optimize R ankle function    Baseline R 25 sec; L 60+ sec    Status New    Target Date 12/27/20                 Plan - 11/24/20 1324    Clinical Impression Statement Pt is making appropriaate progress with his R ankle rehab. R ankle ROM is WNLs and pt is walking without an antalgic gait pattern over the R LE. Pt has returned to work part time, and is to gradually progress to full time over the next 2 weeks. With balance exs pt demonstrates decreased stability with the R ankle. PT today was completed for [facility] exs to improve R ankle strength and balance. Pt tolerated PT with out adverse effect. Pt will benefit from continued PT 2w3, 1w2 to optimize pt's functional use for the R ankle.    Personal Factors and Comorbidities Past/Current Experience;Time since onset of injury/illness/exacerbation;Comorbidity 1    Comorbidities obesity    Examination-Activity Limitations Locomotion Level;Squat;Stairs;Stand    Clinical Decision Making Low    Rehab Potential Good    PT Frequency 2x / week    PT Duration 3 weeks   then 1w2   PT Treatment/Interventions Cryotherapy;Electrical Stimulation;Iontophoresis 4mg /ml Dexamethasone;Moist Heat;Ultrasound;Balance training;Therapeutic exercise;Therapeutic activities;Functional mobility training;Stair training;Gait training;Patient/family education;Manual techniques;Dry needling;Passive range of motion;Taping;Vasopneumatic Device;Joint Manipulations    PT Next Visit Plan Supervised PT out of the ankle brace as tolerated. Pt to begin PT 2w4 starting the week of 11/28/20.    PT Home Exercise Plan 11/30/20    Consulted and Agree with Plan of Care Patient  Patient will benefit from  skilled therapeutic intervention in order to improve the following deficits and impairments:  Difficulty walking,Abnormal gait,Decreased range of motion,Decreased strength,Increased edema,Decreased balance,Decreased activity tolerance,Obesity  Visit Diagnosis: Synovitis of right ankle - Plan: PT plan of care cert/re-cert  Osteochondral talar dome lesion - Plan: PT plan of care cert/re-cert  Localized edema - Plan: PT plan of care cert/re-cert  Difficulty in walking, not elsewhere classified - Plan: PT plan of care cert/re-cert  Sprain of right ankle, unspecified ligament, initial encounter - Plan: PT plan of care cert/re-cert     Problem List Patient Active Problem List   Diagnosis Date Noted  . Osteochondral defect of talus   . Synovitis of right ankle   . Bone cyst of ankle     Joellyn Rued MS, PT 11/24/20 3:34 PM  [facility] Outpatient Rehabilitation [facility] [address] [place], [place], [address] Phone: [phone]   Fax:  [phone]  Name: Christopher Mccann MRN: [id number] Date of Birth: 08-Mar-2001

## 2020-11-24 NOTE — Telephone Encounter (Signed)
Lauren(employer)called again,  is wanting clarification on a letter of restrictions sent 11/15/20 for patient. She would like to know how long will this be in place, how often and how long does he need to sit,to reduce the hours to benefit his medical condition.Please call [phone].

## 2020-11-29 ENCOUNTER — Telehealth: Payer: Self-pay | Admitting: Physical Therapy

## 2020-11-29 ENCOUNTER — Ambulatory Visit: Payer: Medicaid Other | Admitting: Physical Therapy

## 2020-11-29 NOTE — Telephone Encounter (Deleted)
completed

## 2020-11-29 NOTE — Telephone Encounter (Signed)
Lauren(employer) is calling and would like to know patient's upcoming appointment,did receive note w/ restrictions. Returned call to employer and gave the next appointment.

## 2020-11-29 NOTE — Telephone Encounter (Signed)
LVM regarding missed appointment today.  He stated he caught flat tire. I discussed for future reference to call and let us know and we can cancel those appointments for him. I reviewed the date and time of his next scheduled appointment which he stated he plans to attend.   Alexzavier Girardin PT, DPT, LAT, ATC  11/29/20  11:57 AM

## 2020-11-30 DIAGNOSIS — Z419 Encounter for procedure for purposes other than remedying health state, unspecified: Secondary | ICD-10-CM | POA: Diagnosis not present

## 2020-12-01 ENCOUNTER — Ambulatory Visit: Payer: Medicaid Other | Attending: Podiatry | Admitting: Physical Therapy

## 2020-12-01 ENCOUNTER — Encounter: Payer: Self-pay | Admitting: Physical Therapy

## 2020-12-01 ENCOUNTER — Other Ambulatory Visit: Payer: Self-pay

## 2020-12-01 DIAGNOSIS — S92014D Nondisplaced fracture of body of right calcaneus, subsequent encounter for fracture with routine healing: Secondary | ICD-10-CM | POA: Insufficient documentation

## 2020-12-01 DIAGNOSIS — M25571 Pain in right ankle and joints of right foot: Secondary | ICD-10-CM | POA: Diagnosis not present

## 2020-12-01 DIAGNOSIS — M659 Synovitis and tenosynovitis, unspecified: Secondary | ICD-10-CM | POA: Insufficient documentation

## 2020-12-01 DIAGNOSIS — M949 Disorder of cartilage, unspecified: Secondary | ICD-10-CM | POA: Insufficient documentation

## 2020-12-01 DIAGNOSIS — S93401A Sprain of unspecified ligament of right ankle, initial encounter: Secondary | ICD-10-CM | POA: Insufficient documentation

## 2020-12-01 DIAGNOSIS — S93401D Sprain of unspecified ligament of right ankle, subsequent encounter: Secondary | ICD-10-CM | POA: Insufficient documentation

## 2020-12-01 DIAGNOSIS — R262 Difficulty in walking, not elsewhere classified: Secondary | ICD-10-CM | POA: Insufficient documentation

## 2020-12-01 DIAGNOSIS — M899 Disorder of bone, unspecified: Secondary | ICD-10-CM | POA: Diagnosis not present

## 2020-12-01 DIAGNOSIS — S92901D Unspecified fracture of right foot, subsequent encounter for fracture with routine healing: Secondary | ICD-10-CM | POA: Diagnosis not present

## 2020-12-01 DIAGNOSIS — M25371 Other instability, right ankle: Secondary | ICD-10-CM | POA: Insufficient documentation

## 2020-12-01 DIAGNOSIS — R600 Localized edema: Secondary | ICD-10-CM | POA: Insufficient documentation

## 2020-12-01 NOTE — Patient Instructions (Signed)
Access Code: [email or web address] URL: [email or web address] Date: 12/01/2020 Prepared by: Alphonzo Severance  Exercises Standing Eccentric Heel Raise - 1 x daily - 7 x weekly - 3 sets - 10 reps Single Leg Stance (Mirrored) - 2 x daily - 6 x weekly - 1 sets - 3 reps - 30 hold Forward Reach - 1 x daily - 7 x weekly - 3 sets - 5 reps

## 2020-12-01 NOTE — Telephone Encounter (Deleted)
Completed note 

## 2020-12-01 NOTE — Telephone Encounter (Deleted)
Message is complete 

## 2020-12-01 NOTE — Therapy (Signed)
[facility] Outpatient Rehabilitation [facility] [address] [place], [place], [address] Phone: [phone]   Fax:  [phone]  Physical Therapy Treatment  Patient Details  Name: Norvell Caswell MRN: [id number] Date of Birth: 21-Aug-2000 Referring Provider (PT): Edwin Cap, [place]   Encounter Date: 12/01/2020   PT End of Session - 12/01/20 1226    Visit Number 5    Number of Visits 12    Date for PT Re-Evaluation 12/27/20    Authorization Type  MEDICAID [facility] 12 visits; 09/2920-12/27/20    Authorization Time Period 12 visits; 09/2920-12/27/20    Authorization - Visit Number 5    Authorization - Number of Visits 12    Progress Note Due on Visit 10    PT Start Time 1220    PT Stop Time 1300    PT Time Calculation (min) 40 min    Equipment Utilized During Treatment Other (comment)    Activity Tolerance Patient tolerated treatment well    Behavior During Therapy [facility] for tasks assessed/performed           Past Medical History:  Diagnosis Date  . Ankle pain, right   . Depression     Past Surgical History:  Procedure Laterality Date  . CAUTERIZE INNER NOSE    . OSTEOCHONDROMA EXCISION Right 08/30/2020   Procedure: OSTEOCHONDROMA EXCISION VS ARTHROSCOPY ANKLE DEBRIDEMENT WITH OPEN OSTEOCHONDRAL AUTOGRAFT;  Surgeon: Edwin Cap, DPM;  Location: [facility] ORS;  Service: Podiatry;  Laterality: Right;  BLOCK    There were no vitals filed for this visit.   Subjective Assessment - 12/01/20 1226    Subjective Pt reports he is not having R ankle pain currently but does at time afte working a 9 hour shift and standing.  he reports his HEP is going well, but the exercises are fairly easy.    Limitations Walking    Patient Stated Goals To get back to where is was before. To play football.    Pain Score 0-No pain    Pain Onset More than a month ago                             [facility] Adult PT Treatment/Exercise - 12/01/20 0001      High Level  Balance   High Level Balance Comments eyes open SLS on foam, 45''x3; rebounder throw with red ball tandem stance with R foot in rear on foam 4x20      Ankle Exercises: Aerobic   Stationary Bike 5 mins; L4      Ankle Exercises: Standing   Heel Raises --   2 up 1 down 3x10 with UE support   Other Standing Ankle Exercises Forward step ups; R; 15x2 6''. lateral step ups;15x2 8" box    Other Standing Ankle Exercises R SL forward reach to chair ; 15x2      Ankle Exercises: Stretches   Soleus Stretch 1 rep;30 seconds    Gastroc Stretch 1 rep;30 seconds                  PT Education - 12/01/20 1252    Education Details updated HEP    Person(s) Educated Patient    Methods Explanation;Demonstration    Comprehension Verbalized understanding            PT Short Term Goals - 11/09/20 1259      PT SHORT TERM GOAL #1   Title Pt will be Ind in an  initial HEP    Status Achieved    Target Date 11/09/20      PT SHORT TERM GOAL #2   Title Pt will voice undestanding of measures to assist with pain and swelling management.    Status Achieved    Target Date 11/09/20             PT Long Term Goals - 11/24/20 1317      PT LONG TERM GOAL #1   Title Increase R ankle PF to 50d for appropropriate toe off with terminal stance. 11/24/20- R ankle ROM = 16-50d    Status Achieved    Target Date 11/24/20      PT LONG TERM GOAL #2   Title The R ankle will demonstrate 5/5 strength for appropriate stability    Baseline see objective-grossly 4 to 4+/5    Status On-going      PT LONG TERM GOAL #3   Title Pt will walk with a normalized gait pattern with no to mild antalgic gait pattern    Status Achieved      PT LONG TERM GOAL #4   Title Pt will be able to asc/dsc 12 steps c a step over step gait pattern and without the use of a handrail    Status On-going    Target Date 12/27/20      PT LONG TERM GOAL #5   Title Increase R SL standing time to 50 sec for improved R ankle stability and  balance to optimize R ankle function    Baseline R 25 sec; L 60+ sec    Status New    Target Date 12/27/20                 Plan - 12/01/20 1257    Clinical Impression Statement Pt is progressing well with therapy.  We progressed intensity of several exercises today.  We were able to progress to eccentric lowering with heel raises.  Pt must be cued for full PF ROM during single leg phase of heel raises.  Pt shows some instability with forward reaching which improves with repetition.  Updated HEP.    Personal Factors and Comorbidities Past/Current Experience;Time since onset of injury/illness/exacerbation;Comorbidity 1    Comorbidities obesity    Examination-Activity Limitations Locomotion Level;Squat;Stairs;Stand    Rehab Potential Good    PT Frequency 2x / week    PT Duration 3 weeks   then 1w2   PT Treatment/Interventions Cryotherapy;Electrical Stimulation;Iontophoresis 4mg /ml Dexamethasone;Moist Heat;Ultrasound;Balance training;Therapeutic exercise;Therapeutic activities;Functional mobility training;Stair training;Gait training;Patient/family education;Manual techniques;Dry needling;Passive range of motion;Taping;Vasopneumatic Device;Joint Manipulations    PT Next Visit Plan Supervised PT out of the ankle brace as tolerated. Pt to begin PT 2w4 starting the week of 11/28/20.    PT Home Exercise Plan 11/30/20    Consulted and Agree with Plan of Care Patient           Patient will benefit from skilled therapeutic intervention in order to improve the following deficits and impairments:  Difficulty walking,Abnormal gait,Decreased range of motion,Decreased strength,Increased edema,Decreased balance,Decreased activity tolerance,Obesity  Visit Diagnosis: Synovitis of right ankle  Osteochondral talar dome lesion  Localized edema  Difficulty in walking, not elsewhere classified  Sprain of right ankle, unspecified ligament, initial encounter  Unstable right ankle     Problem  List Patient Active Problem List   Diagnosis Date Noted  . Osteochondral defect of talus   . Synovitis of right ankle   . Bone cyst of ankle     [id number]  Jeric Slagel PT, DPT 12/01/20 1:03 PM  [facility] Outpatient Rehabilitation [facility] [address] [place], [place], [address] Phone: [phone]   Fax:  [phone]  Name: Varnell Orvis MRN: [id number] Date of Birth: 27-Jun-2001

## 2020-12-05 ENCOUNTER — Ambulatory Visit: Payer: Medicaid Other | Admitting: Physical Therapy

## 2020-12-05 ENCOUNTER — Other Ambulatory Visit: Payer: Self-pay

## 2020-12-05 ENCOUNTER — Encounter: Payer: Self-pay | Admitting: Physical Therapy

## 2020-12-05 DIAGNOSIS — S92014D Nondisplaced fracture of body of right calcaneus, subsequent encounter for fracture with routine healing: Secondary | ICD-10-CM | POA: Diagnosis not present

## 2020-12-05 DIAGNOSIS — M949 Disorder of cartilage, unspecified: Secondary | ICD-10-CM | POA: Diagnosis not present

## 2020-12-05 DIAGNOSIS — M899 Disorder of bone, unspecified: Secondary | ICD-10-CM

## 2020-12-05 DIAGNOSIS — M25371 Other instability, right ankle: Secondary | ICD-10-CM | POA: Diagnosis not present

## 2020-12-05 DIAGNOSIS — S93401A Sprain of unspecified ligament of right ankle, initial encounter: Secondary | ICD-10-CM

## 2020-12-05 DIAGNOSIS — S92901D Unspecified fracture of right foot, subsequent encounter for fracture with routine healing: Secondary | ICD-10-CM | POA: Diagnosis not present

## 2020-12-05 DIAGNOSIS — M25571 Pain in right ankle and joints of right foot: Secondary | ICD-10-CM | POA: Diagnosis not present

## 2020-12-05 DIAGNOSIS — M659 Synovitis and tenosynovitis, unspecified: Secondary | ICD-10-CM | POA: Diagnosis not present

## 2020-12-05 DIAGNOSIS — S93401D Sprain of unspecified ligament of right ankle, subsequent encounter: Secondary | ICD-10-CM | POA: Diagnosis not present

## 2020-12-05 DIAGNOSIS — R262 Difficulty in walking, not elsewhere classified: Secondary | ICD-10-CM

## 2020-12-05 DIAGNOSIS — R600 Localized edema: Secondary | ICD-10-CM

## 2020-12-05 NOTE — Therapy (Signed)
[facility] [address] [place], [place], [address] Phone: [phone]   Fax:  [phone]  Physical Therapy Treatment  Patient Details  Name: Christopher Mccann MRN: [id number] Date of Birth: 2000/07/27 Referring Provider (PT): Edwin Cap, [place]   Encounter Date: 12/05/2020   PT End of Session - 12/05/20 1149    Visit Number 6    Number of Visits 12    Date for PT Re-Evaluation 12/27/20    Authorization Type [place] MEDICAID [place] 12 visits; 09/2920-12/27/20    Authorization Time Period 12 visits; 09/2920-12/27/20    Authorization - Visit Number 5    Authorization - Number of Visits 13    PT Start Time 1149    PT Stop Time 1228    PT Time Calculation (min) 39 min    Activity Tolerance Patient tolerated treatment well    Behavior During Therapy [facility] for tasks assessed/performed           Past Medical History:  Diagnosis Date  . Ankle pain, right   . Depression     Past Surgical History:  Procedure Laterality Date  . CAUTERIZE INNER NOSE    . OSTEOCHONDROMA EXCISION Right 08/30/2020   Procedure: OSTEOCHONDROMA EXCISION VS ARTHROSCOPY ANKLE DEBRIDEMENT WITH OPEN OSTEOCHONDRAL AUTOGRAFT;  Surgeon: Edwin Cap, DPM;  Location: [facility] ORS;  Service: Podiatry;  Laterality: Right;  BLOCK    There were no vitals filed for this visit.   Subjective Assessment - 12/05/20 1151    Subjective "I am doing better still getting soreness at the end of a long day which I do take some ibuprofen."    Patient Stated Goals To get back to where is was before. To play football.    Currently in Pain? Yes    Pain Score 0-No pain   at worst 3-4/10   Pain Onset More than a month ago    Pain Frequency Intermittent    Aggravating Factors  prolonged standing/ walking.    Pain Relieving Factors medication              [facility] PT Assessment - 12/05/20 0001      Assessment   Medical Diagnosis Osteochondral talar dome lesion; Sprain of right  ankle, unspecified ligament, initial encounter; Synovitis of right ankle    Referring Provider (PT) Edwin Cap, DPM                         [facility] Adult PT Treatment/Exercise - 12/05/20 0001      Knee/Hip Exercises: Standing   Hip Abduction Stengthening;Both;2 sets;20 reps;Knee straight   with green theraband   Functional Squat 2 sets;15 reps   holding onto free motion with bil UE     Ankle Exercises: Aerobic   Elliptical L5 x 5 min ramp L5      Ankle Exercises: Standing   Heel Raises --   single leg to fatigue x 3 sets   Toe Walk (Round Trip) 6 x 20 ft    Other Standing Ankle Exercises forward step up/ down      Ankle Exercises: Stretches   Soleus Stretch 1 rep;30 seconds   slant board   Gastroc Stretch 1 rep;30 seconds   slant board              Balance Exercises - 12/05/20 0001      Balance Exercises: Standing   SLS Eyes open;Solid surface;4 reps   attempted 20 second but pt  was able to hold max of 8 seconds            PT Education - 12/05/20 1231    Education Details reviewed SLS balance and how to incorporate it into his current HEP.    Person(s) Educated Patient    Methods Explanation;Verbal cues;Handout    Comprehension Verbalized understanding;Verbal cues required            PT Short Term Goals - 11/09/20 1259      PT SHORT TERM GOAL #1   Title Pt will be Ind in an initial HEP    Status Achieved    Target Date 11/09/20      PT SHORT TERM GOAL #2   Title Pt will voice undestanding of measures to assist with pain and swelling management.    Status Achieved    Target Date 11/09/20             PT Long Term Goals - 11/24/20 1317      PT LONG TERM GOAL #1   Title Increase R ankle PF to 50d for appropropriate toe off with terminal stance. 11/24/20- R ankle ROM = 16-50d    Status Achieved    Target Date 11/24/20      PT LONG TERM GOAL #2   Title The R ankle will demonstrate 5/5 strength for appropriate stability    Baseline  see objective-grossly 4 to 4+/5    Status On-going      PT LONG TERM GOAL #3   Title Pt will walk with a normalized gait pattern with no to mild antalgic gait pattern    Status Achieved      PT LONG TERM GOAL #4   Title Pt will be able to asc/dsc 12 steps c a step over step gait pattern and without the use of a handrail    Status On-going    Target Date 12/27/20      PT LONG TERM GOAL #5   Title Increase R SL standing time to 50 sec for improved R ankle stability and balance to optimize R ankle function    Baseline R 25 sec; L 60+ sec    Status New    Target Date 12/27/20                 Plan - 12/05/20 1228    Clinical Impression Statement pt arrives reporting no pain but does note feeling fatigue/ soreness after working a full day. continued working on ankle strengthening increasing reps to promote endurnace, worked on strengthening up the chain to promote distal control. He did well with all exericses noting no pain during or following activity.    PT Treatment/Interventions Cryotherapy;Electrical Stimulation;Iontophoresis 4mg /ml Dexamethasone;Moist Heat;Ultrasound;Balance training;Therapeutic exercise;Therapeutic activities;Functional mobility training;Stair training;Gait training;Patient/family education;Manual techniques;Dry needling;Passive range of motion;Taping;Vasopneumatic Device;Joint Manipulations    PT Next Visit Plan Supervised PT out of the ankle brace as tolerated. Pt to begin PT 2w4 starting the week of 11/28/20.    PT Home Exercise Plan 11/30/20    Consulted and Agree with Plan of Care Patient           Patient will benefit from skilled therapeutic intervention in order to improve the following deficits and impairments:  Difficulty walking,Abnormal gait,Decreased range of motion,Decreased strength,Increased edema,Decreased balance,Decreased activity tolerance,Obesity  Visit Diagnosis: Synovitis of right ankle  Osteochondral talar dome lesion  Localized  edema  Difficulty in walking, not elsewhere classified  Sprain of right ankle, unspecified ligament, initial encounter  Problem List Patient Active Problem List   Diagnosis Date Noted  . Osteochondral defect of talus   . Synovitis of right ankle   . Bone cyst of ankle    Lulu Riding PT, DPT, LAT, ATC  12/05/20  12:33 PM      [facility] Out[facility] [address] [place], [place], [address] Phone: [phone]   Fax:  [phone]  Name: Abdulloh Ullom MRN: [id number] Date of Birth: 2001/03/19

## 2020-12-06 NOTE — Telephone Encounter (Deleted)
Completed.

## 2020-12-07 ENCOUNTER — Encounter: Payer: Medicaid Other | Admitting: Physical Therapy

## 2020-12-07 NOTE — Telephone Encounter (Signed)
Please advise 

## 2020-12-07 NOTE — Telephone Encounter (Signed)
If he is doing well without pain he may return to full work duty if you can check with him. Thanks

## 2020-12-08 NOTE — Telephone Encounter (Signed)
Called patient and he is back to work 9 hours 3-4 days a week, still a little soreness after work, is on feet all day. He does not need a work note.

## 2020-12-12 ENCOUNTER — Ambulatory Visit: Payer: Medicaid Other | Admitting: Physical Therapy

## 2020-12-12 ENCOUNTER — Encounter: Payer: Self-pay | Admitting: Physical Therapy

## 2020-12-12 ENCOUNTER — Other Ambulatory Visit: Payer: Self-pay

## 2020-12-12 DIAGNOSIS — M65971 Unspecified synovitis and tenosynovitis, right ankle and foot: Secondary | ICD-10-CM

## 2020-12-12 DIAGNOSIS — M25571 Pain in right ankle and joints of right foot: Secondary | ICD-10-CM | POA: Diagnosis not present

## 2020-12-12 DIAGNOSIS — M949 Disorder of cartilage, unspecified: Secondary | ICD-10-CM | POA: Diagnosis not present

## 2020-12-12 DIAGNOSIS — M899 Disorder of bone, unspecified: Secondary | ICD-10-CM | POA: Diagnosis not present

## 2020-12-12 DIAGNOSIS — M659 Synovitis and tenosynovitis, unspecified: Secondary | ICD-10-CM

## 2020-12-12 DIAGNOSIS — M25371 Other instability, right ankle: Secondary | ICD-10-CM | POA: Diagnosis not present

## 2020-12-12 DIAGNOSIS — R600 Localized edema: Secondary | ICD-10-CM | POA: Diagnosis not present

## 2020-12-12 DIAGNOSIS — S93401A Sprain of unspecified ligament of right ankle, initial encounter: Secondary | ICD-10-CM

## 2020-12-12 DIAGNOSIS — R262 Difficulty in walking, not elsewhere classified: Secondary | ICD-10-CM

## 2020-12-12 DIAGNOSIS — S92014D Nondisplaced fracture of body of right calcaneus, subsequent encounter for fracture with routine healing: Secondary | ICD-10-CM | POA: Diagnosis not present

## 2020-12-12 DIAGNOSIS — S93401D Sprain of unspecified ligament of right ankle, subsequent encounter: Secondary | ICD-10-CM | POA: Diagnosis not present

## 2020-12-12 DIAGNOSIS — S92901D Unspecified fracture of right foot, subsequent encounter for fracture with routine healing: Secondary | ICD-10-CM | POA: Diagnosis not present

## 2020-12-12 NOTE — Therapy (Signed)
[facility] [address] [place], [place], [address] Phone: [phone]   Fax:  [phone]  Physical Therapy Treatment  Patient Details  Name: Christopher Mccann MRN: [id number] Date of Birth: 03/10/2001 Referring Provider (PT): Edwin Cap, [place]   Encounter Date: 12/12/2020   PT End of Session - 12/12/20 1155     Visit Number 7    Number of Visits 12    Date for PT Re-Evaluation 12/27/20    Authorization Type [place] MEDICAID [facility] 12 visits; 09/2920-12/27/20    Authorization Time Period 12 visits; 09/2920-12/27/20    Authorization - Visit Number 6    Authorization - Number of Visits 13    PT Start Time 1150   pt arrived late   PT Stop Time 1228    PT Time Calculation (min) 38 min    Activity Tolerance Patient tolerated treatment well    Behavior During Therapy [facility] for tasks assessed/performed             Past Medical History:  Diagnosis Date   Ankle pain, right    Depression     Past Surgical History:  Procedure Laterality Date   CAUTERIZE INNER NOSE     OSTEOCHONDROMA EXCISION Right 08/30/2020   Procedure: OSTEOCHONDROMA EXCISION VS ARTHROSCOPY ANKLE DEBRIDEMENT WITH OPEN OSTEOCHONDRAL AUTOGRAFT;  Surgeon: Edwin Cap, DPM;  Location: [facility] ORS;  Service: Podiatry;  Laterality: Right;  BLOCK    There were no vitals filed for this visit.   Subjective Assessment - 12/12/20 1156     Subjective " I am doing pretty good, I still get some soreness which occurs mostly after work or a long day."    Patient Stated Goals To get back to where is was before. To play football.    Currently in Pain? No/denies    Aggravating Factors  end of day following work                               [facility] Adult PT Treatment/Exercise - 12/12/20 0001       Knee/Hip Exercises: Standing   Functional Squat 2 sets;20 reps   bil HHA from free motion   Wall Squat Limitations sustainted wall sit with 10 heel raises.     Other Standing Knee Exercises marching in place with green band around foot maintiain DF 2 x 20 alternating L/R    Other Standing Knee Exercises lateral band walks 4 x 15 steps with GTB around ankles      Ankle Exercises: Aerobic   Elliptical L5 x 5 min ramp L10      Ankle Exercises: Stretches   Soleus Stretch 1 rep;30 seconds   slant board   Gastroc Stretch 1 rep;30 seconds   slant board     Ankle Exercises: Standing   Heel Raises --   RLE only on slant board to fatigue, then progressing to con bil/ ecc RLE to fatigue x 2 sets                Balance Exercises - 12/12/20 0001       Balance Exercises: Standing   SLS Eyes open;4 reps   1st trial 23 seconds, then 14 seconds, 9 seocnds on the 3rd attempt. 4th 34 seconds with moderate postural sway noted during.                PT Short Term Goals - 11/09/20 1259  PT SHORT TERM GOAL #1   Title Pt will be Ind in an initial HEP    Status Achieved    Target Date 11/09/20      PT SHORT TERM GOAL #2   Title Pt will voice undestanding of measures to assist with pain and swelling management.    Status Achieved    Target Date 11/09/20               PT Long Term Goals - 11/24/20 1317       PT LONG TERM GOAL #1   Title Increase R ankle PF to 50d for appropropriate toe off with terminal stance. 11/24/20- R ankle ROM = 16-50d    Status Achieved    Target Date 11/24/20      PT LONG TERM GOAL #2   Title The R ankle will demonstrate 5/5 strength for appropriate stability    Baseline see objective-grossly 4 to 4+/5    Status On-going      PT LONG TERM GOAL #3   Title Pt will walk with a normalized gait pattern with no to mild antalgic gait pattern    Status Achieved      PT LONG TERM GOAL #4   Title Pt will be able to asc/dsc 12 steps c a step over step gait pattern and without the use of a handrail    Status On-going    Target Date 12/27/20      PT LONG TERM GOAL #5   Title Increase R SL standing time to  50 sec for improved R ankle stability and balance to optimize R ankle function    Baseline R 25 sec; L 60+ sec    Status New    Target Date 12/27/20                   Plan - 12/12/20 1203     Clinical Impression Statement Christopher Mccann continues to report no pain coming into treatment, but does continue to note soreness that occurs mostly at the end of a day of standing/ walking associated with work. Continued working on gross LE strength and endurance with increased reps/ sets. He did well with all exercises today noting some fatigue but overall doing very well.    PT Treatment/Interventions Cryotherapy;Electrical Stimulation;Iontophoresis 4mg /ml Dexamethasone;Moist Heat;Ultrasound;Balance training;Therapeutic exercise;Therapeutic activities;Functional mobility training;Stair training;Gait training;Patient/family education;Manual techniques;Dry needling;Passive range of motion;Taping;Vasopneumatic Device;Joint Manipulations    PT Next Visit Plan continued progressing functional strength/ endurnace, lifting mechanics             Patient will benefit from skilled therapeutic intervention in order to improve the following deficits and impairments:  Difficulty walking, Abnormal gait, Decreased range of motion, Decreased strength, Increased edema, Decreased balance, Decreased activity tolerance, Obesity  Visit Diagnosis: Synovitis of right ankle  Osteochondral talar dome lesion  Localized edema  Difficulty in walking, not elsewhere classified  Sprain of right ankle, unspecified ligament, initial encounter     Problem List Patient Active Problem List   Diagnosis Date Noted   Osteochondral defect of talus    Synovitis of right ankle    Bone cyst of ankle    Christopher Mccann PT, DPT, LAT, ATC  12/12/20  12:26 PM     [facility] [address] [place], [place], [place] Phone: [phone]   Fax:  [phone]  Name:  Christopher Mccann MRN: Laroy Apple Date of Birth: [birth year]

## 2020-12-14 ENCOUNTER — Encounter: Payer: Medicaid Other | Admitting: Physical Therapy

## 2020-12-14 ENCOUNTER — Ambulatory Visit: Payer: Medicaid Other | Admitting: Physical Therapy

## 2020-12-20 ENCOUNTER — Ambulatory Visit: Payer: Medicaid Other | Admitting: Physical Therapy

## 2020-12-21 ENCOUNTER — Other Ambulatory Visit: Payer: Self-pay

## 2020-12-21 ENCOUNTER — Ambulatory Visit: Payer: Medicaid Other

## 2020-12-21 DIAGNOSIS — S92014D Nondisplaced fracture of body of right calcaneus, subsequent encounter for fracture with routine healing: Secondary | ICD-10-CM

## 2020-12-21 DIAGNOSIS — S92901D Unspecified fracture of right foot, subsequent encounter for fracture with routine healing: Secondary | ICD-10-CM | POA: Diagnosis not present

## 2020-12-21 DIAGNOSIS — M25371 Other instability, right ankle: Secondary | ICD-10-CM

## 2020-12-21 DIAGNOSIS — R600 Localized edema: Secondary | ICD-10-CM | POA: Diagnosis not present

## 2020-12-21 DIAGNOSIS — S93401D Sprain of unspecified ligament of right ankle, subsequent encounter: Secondary | ICD-10-CM

## 2020-12-21 DIAGNOSIS — M25571 Pain in right ankle and joints of right foot: Secondary | ICD-10-CM | POA: Diagnosis not present

## 2020-12-21 DIAGNOSIS — S93401A Sprain of unspecified ligament of right ankle, initial encounter: Secondary | ICD-10-CM | POA: Diagnosis not present

## 2020-12-21 DIAGNOSIS — M899 Disorder of bone, unspecified: Secondary | ICD-10-CM | POA: Diagnosis not present

## 2020-12-21 DIAGNOSIS — R262 Difficulty in walking, not elsewhere classified: Secondary | ICD-10-CM | POA: Diagnosis not present

## 2020-12-21 DIAGNOSIS — M949 Disorder of cartilage, unspecified: Secondary | ICD-10-CM | POA: Diagnosis not present

## 2020-12-21 DIAGNOSIS — M659 Synovitis and tenosynovitis, unspecified: Secondary | ICD-10-CM | POA: Diagnosis not present

## 2020-12-21 NOTE — Therapy (Addendum)
[place], [place], [address] Phone: [phone]   Fax:  [phone]  Physical Therapy Treatment/ Discharge Summary  Patient Details  Name: Christopher Mccann MRN: [id number] Date of Birth: June 22, 2001 Referring Provider (PT): Criselda Peaches, [place]   Encounter Date: 12/21/2020   PT End of Session - 12/21/20 1213     Visit Number 8    Number of Visits 12    Date for PT Re-Evaluation 12/27/20    Authorization Type [place] MEDICAID [facility] 12 visits; 09/2920-12/27/20    Authorization Time Period 12 visits; 09/2920-12/27/20    Authorization - Visit Number 7    Authorization - Number of Visits 13    Progress Note Due on Visit 10    PT Start Time 1010    PT Stop Time 1048    PT Time Calculation (min) 38 min    Equipment Utilized During Treatment Other (comment)    Activity Tolerance Patient tolerated treatment well    Behavior During Therapy [facility] for tasks assessed/performed             Past Medical History:  Diagnosis Date   Ankle pain, right    Depression     Past Surgical History:  Procedure Laterality Date   CAUTERIZE INNER NOSE     OSTEOCHONDROMA EXCISION Right 08/30/2020   Procedure: OSTEOCHONDROMA EXCISION VS ARTHROSCOPY ANKLE DEBRIDEMENT WITH OPEN OSTEOCHONDRAL AUTOGRAFT;  Surgeon: Criselda Peaches, DPM;  Location: [facility] ORS;  Service: Podiatry;  Laterality: Right;  BLOCK    There were no vitals filed for this visit.   Subjective Assessment - 12/21/20 1010     Subjective Pt reports still feeling "a little weak and a little sore" especially after being on his feet for 10 hours per day. He says he feels slightly unsteady when walking on non-compliant surfaces like his gravel driveway. He states he completes his HEP every day.    Limitations Walking    Patient Stated Goals To get back to where is was before. To play football.    Currently in Pain? No/denies    Pain Score 0-No pain                                [facility] Adult PT Treatment/Exercise - 12/21/20 0001       High Level Balance   High Level Balance Activities --   Single leg balance clocks x10 BIL     Knee/Hip Exercises: Standing   SLS On Airex Pad 3x30sec    Other Standing Knee Exercises Marching on Bosu ball with high knees 3x30sec    Other Standing Knee Exercises Woodpeckers 3x5                    PT Education - 12/21/20 1045     Education Details Pt instructed on proper form when performing newly added HEP exercises.    Person(s) Educated Patient    Methods Explanation;Demonstration;Handout;Verbal cues    Comprehension Verbal cues required;Verbalized understanding;Returned demonstration              PT Short Term Goals - 11/09/20 1259       PT SHORT TERM GOAL #1   Title Pt will be Ind in an initial HEP    Status Achieved    Target Date 11/09/20      PT SHORT TERM GOAL #2   Title Pt will voice undestanding of measures to  assist with pain and swelling management.    Status Achieved    Target Date 11/09/20               PT Long Term Goals - 11/24/20 1317       PT LONG TERM GOAL #1   Title Increase R ankle PF to 50d for appropropriate toe off with terminal stance. 11/24/20- R ankle ROM = 16-50d    Status Achieved    Target Date 11/24/20      PT LONG TERM GOAL #2   Title The R ankle will demonstrate 5/5 strength for appropriate stability    Baseline see objective-grossly 4 to 4+/5    Status On-going      PT LONG TERM GOAL #3   Title Pt will walk with a normalized gait pattern with no to mild antalgic gait pattern    Status Achieved      PT LONG TERM GOAL #4   Title Pt will be able to asc/dsc 12 steps c a step over step gait pattern and without the use of a handrail    Status On-going    Target Date 12/27/20      PT LONG TERM GOAL #5   Title Increase R SL standing time to 50 sec for improved R ankle stability and balance to optimize R ankle function     Baseline R 25 sec; L 60+ sec    Status New    Target Date 12/27/20                   Plan - 12/21/20 1259     Clinical Impression Statement Pt continues to report no pain, although he does continue to note soreness that occurs mostly at the end of a day of standing/ walking associated with work. Pt responded well to newly added balance and plyometric exercises, demonstrating ability to perform exercises with proper form and without increase in pain. Due to pt arriving 10 minutes late to his appointment, the session was truncated. Pt will continue to benefit from skilled PT to address his primary impairments and help him return to his prior level of function without limitation due to pain.    Personal Factors and Comorbidities Past/Current Experience;Time since onset of injury/illness/exacerbation;Comorbidity 1    Comorbidities obesity    Examination-Activity Limitations Locomotion Level;Squat;Stairs;Stand    Stability/Clinical Decision Making Stable/Uncomplicated    Clinical Decision Making Low    Rehab Potential Good    PT Frequency 2x / week    PT Duration 3 weeks    PT Treatment/Interventions Cryotherapy;Electrical Stimulation;Iontophoresis 75m/ml Dexamethasone;Moist Heat;Ultrasound;Balance training;Therapeutic exercise;Therapeutic activities;Functional mobility training;Stair training;Gait training;Patient/family education;Manual techniques;Dry needling;Passive range of motion;Taping;Vasopneumatic Device;Joint Manipulations    PT Next Visit Plan continued progressing functional strength/ endurnace, standing and single-leg balance    PT Home Exercise Plan [id number]   Consulted and Agree with Plan of Care Patient             Patient will benefit from skilled therapeutic intervention in order to improve the following deficits and impairments:  Difficulty walking, Abnormal gait, Decreased range of motion, Decreased strength, Increased edema, Decreased balance, Decreased  activity tolerance, Obesity  Visit Diagnosis: Synovitis of right ankle  Localized edema  Difficulty in walking, not elsewhere classified  Osteochondral talar dome lesion  Sprain of right ankle, unspecified ligament, initial encounter  Unstable right ankle  Pain in right ankle and joints of right foot  Sprain of ligament of right ankle, subsequent encounter  Closed  nondisplaced fracture of body of right calcaneus with routine healing, subsequent encounter  Bone disease  Disorder of bone and cartilage  Closed fracture of right foot with routine healing, subsequent encounter     Problem List Patient Active Problem List   Diagnosis Date Noted   Osteochondral defect of talus    Synovitis of right ankle    Bone cyst of ankle      [facility] [address] [place], [place], [address] Phone: [phone]   Fax:  [phone]  Name: Christopher Mccann MRN: [id number] Date of Birth: Jan 04, 2001  PHYSICAL THERAPY DISCHARGE SUMMARY  Visits from Start of Care: 8  Current functional level related to goals / functional outcomes: Unable to assess pt's ability to ascend/ descend stairs due to pt being discharged from PT due to violation of attendance policy. Pt reports soreness after standing on his feet for several hours during the day, but otherwise has achieved pain/ symptom relief.   Remaining deficits: Foot soreness after standing for long hours during the day; mild ankle weakness/ balance deficits   Education / Equipment: Pt provided a robust HEP.   Patient agrees to discharge. Patient goals were partially met. Patient is being discharged due to 3 no-show's in accordance with clinic's attendance policy.  Vanessa Christiansburg, PT, DPT 12/26/20 11:20 AM

## 2020-12-21 NOTE — Patient Instructions (Signed)
  [id number]

## 2020-12-26 ENCOUNTER — Ambulatory Visit: Payer: Medicaid Other

## 2020-12-26 ENCOUNTER — Telehealth: Payer: Self-pay

## 2020-12-26 NOTE — Telephone Encounter (Signed)
Pt did not answer call and does not have a voicemail box set up. Called pt to report that he is discharged from [facility] PT due to 3 no-shows in accordance with the clinic's attendance policy.

## 2020-12-27 ENCOUNTER — Encounter: Payer: Medicaid Other | Admitting: Physical Therapy

## 2020-12-30 DIAGNOSIS — Z419 Encounter for procedure for purposes other than remedying health state, unspecified: Secondary | ICD-10-CM | POA: Diagnosis not present

## 2021-01-10 ENCOUNTER — Ambulatory Visit (INDEPENDENT_AMBULATORY_CARE_PROVIDER_SITE_OTHER): Payer: Medicaid Other | Admitting: Podiatry

## 2021-01-10 ENCOUNTER — Other Ambulatory Visit: Payer: Self-pay

## 2021-01-10 ENCOUNTER — Encounter: Payer: Self-pay | Admitting: Podiatry

## 2021-01-10 ENCOUNTER — Telehealth: Payer: Self-pay | Admitting: *Deleted

## 2021-01-10 DIAGNOSIS — M659 Synovitis and tenosynovitis, unspecified: Secondary | ICD-10-CM | POA: Diagnosis not present

## 2021-01-10 DIAGNOSIS — M949 Disorder of cartilage, unspecified: Secondary | ICD-10-CM | POA: Diagnosis not present

## 2021-01-10 DIAGNOSIS — S93401S Sprain of unspecified ligament of right ankle, sequela: Secondary | ICD-10-CM

## 2021-01-10 DIAGNOSIS — M899 Disorder of bone, unspecified: Secondary | ICD-10-CM

## 2021-01-10 NOTE — Telephone Encounter (Signed)
completed

## 2021-01-10 NOTE — Progress Notes (Signed)
  Subjective:  Patient ID: Tasean Mancha, male    DOB: 06-19-2001,  MRN: [id number]  Chief Complaint  Patient presents with   Foot Pain    8w f/u right    20 y.o. male presents with the above complaint. History confirmed with patient.  Overall is doing quite well now over 4 months status post OCD repair.  He started back to work full-time and is working 5 days a week with full day shift.  He notices swelling and some discomfort at the end of the day.  Objective:  Physical Exam: warm, good capillary refill, no trophic changes or ulcerative lesions, normal DP and PT pulses, and normal sensory exam.  Right Foot: Good full smooth range of motion of the ankle joint the does not have any pain.  There is minimal edema currently.  No pain on palpation.  No crepitance during range of motion  Assessment:   1. Osteochondral talar dome lesion   2. Synovitis of right ankle   3. Sprain of right ankle, unspecified ligament, sequela      Plan:  Patient was evaluated and treated and all questions answered.  Overall doing well.  I do expect some swelling and edema and pain after a full shift considering the amount of work he does on his feet.  I think this will continue to improve.  I dispensed him a compression sleeve which she will continue to wear.  I do not think he needs to go back to physical therapy or wear the boot or brace at this point.  He will continue to monitor and follow-up with me in 8 weeks for a 6-month follow-up postop.  If worsens at some point would want new MRI to reevaluate if not improving or sharply worsens  No follow-ups on file.

## 2021-01-10 NOTE — Telephone Encounter (Signed)
error 

## 2021-01-16 ENCOUNTER — Ambulatory Visit: Payer: Medicaid Other | Admitting: Podiatry

## 2021-01-30 DIAGNOSIS — Z419 Encounter for procedure for purposes other than remedying health state, unspecified: Secondary | ICD-10-CM | POA: Diagnosis not present

## 2021-03-02 DIAGNOSIS — Z419 Encounter for procedure for purposes other than remedying health state, unspecified: Secondary | ICD-10-CM | POA: Diagnosis not present

## 2021-03-07 ENCOUNTER — Ambulatory Visit: Payer: Medicaid Other | Admitting: Podiatry

## 2021-03-27 ENCOUNTER — Ambulatory Visit: Payer: Medicaid Other | Admitting: Podiatry

## 2021-04-01 DIAGNOSIS — Z419 Encounter for procedure for purposes other than remedying health state, unspecified: Secondary | ICD-10-CM | POA: Diagnosis not present

## 2021-04-10 ENCOUNTER — Ambulatory Visit: Payer: Medicaid Other | Admitting: Podiatry

## 2021-05-02 DIAGNOSIS — Z419 Encounter for procedure for purposes other than remedying health state, unspecified: Secondary | ICD-10-CM | POA: Diagnosis not present

## 2021-05-16 IMAGING — RF DG C-ARM 1-60 MIN
1 series · 2 of 2 positions shown · non-contrast
Comparison: CT 03/18/2020.

CLINICAL DATA: Right ankle surgery

EXAM:
DG C-ARM 1-60 MIN; RIGHT ANKLE - 2 VIEW
FLUOROSCOPY TIME:  Fluoroscopy Time:  6 seconds.

[Series 1: dg x-ray · 0.20mm/px · 2 of 2 slices shown]
[im 1/2]
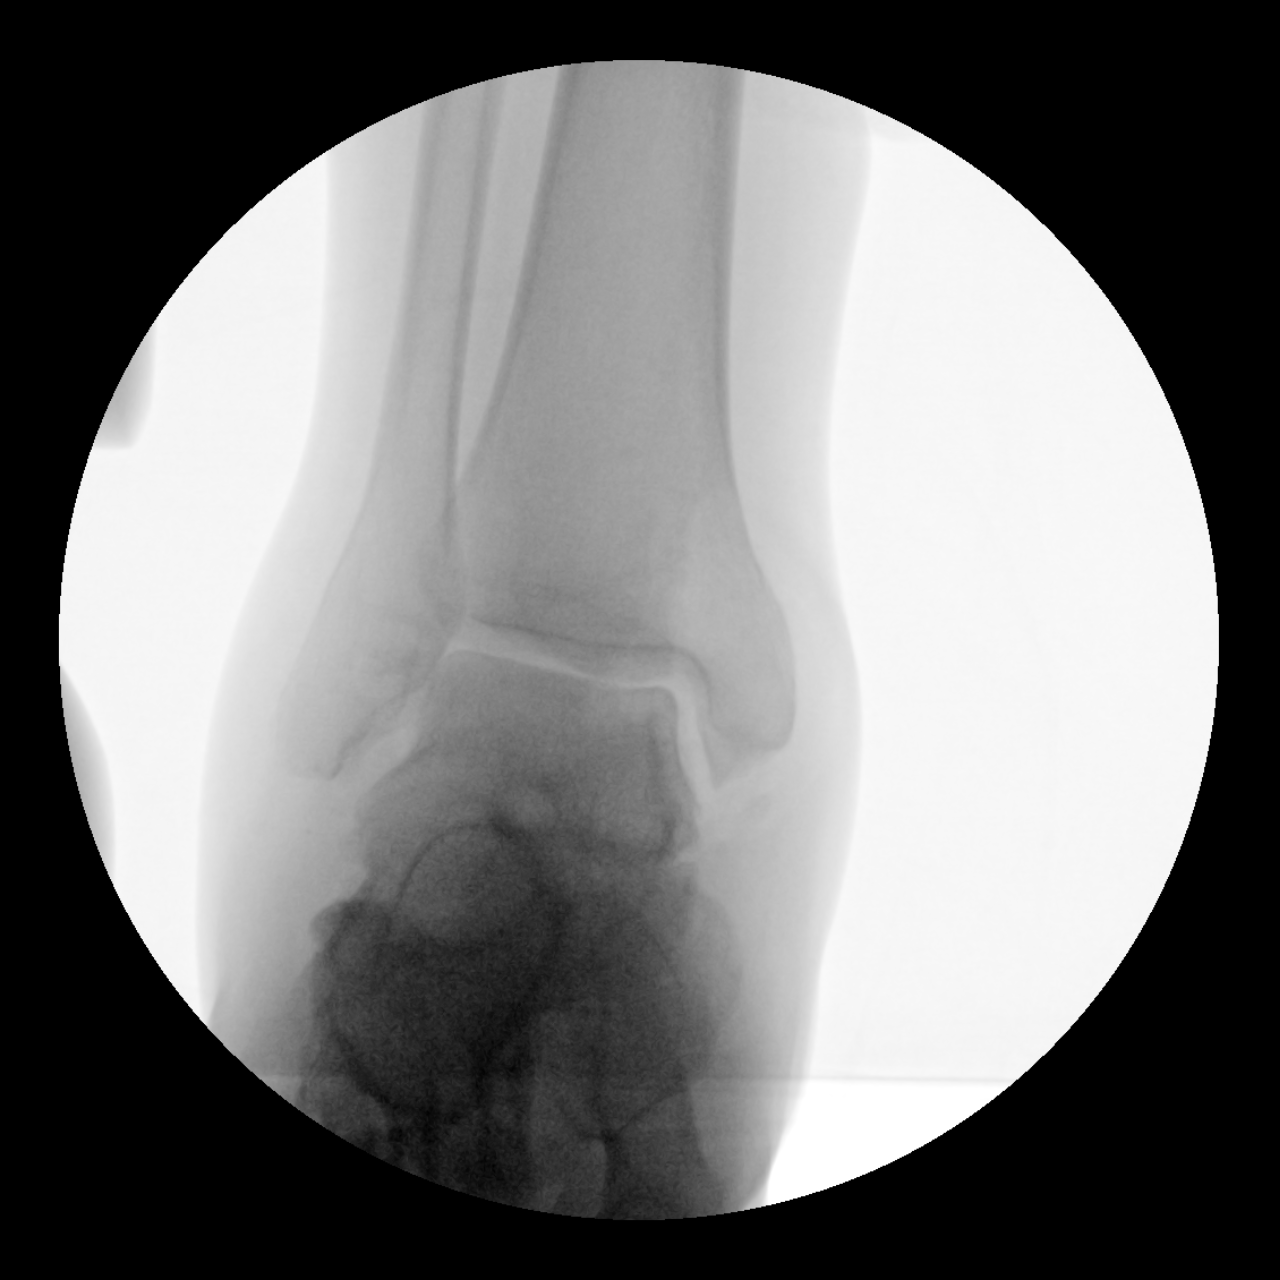
[im 2/2]
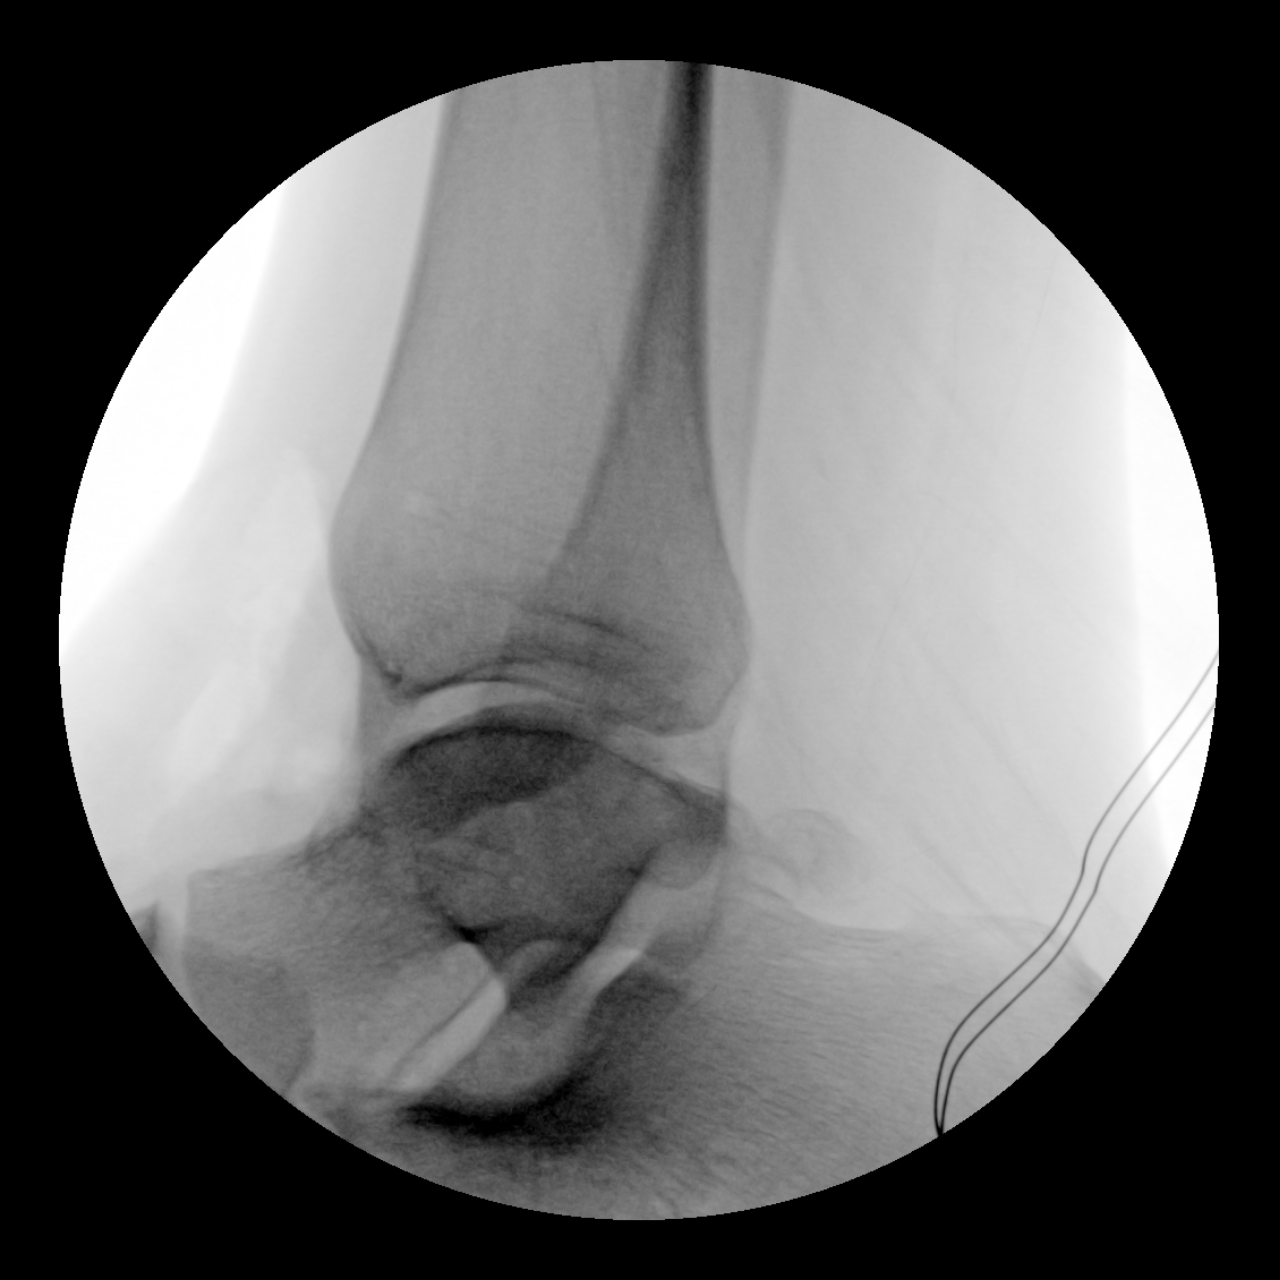

[2 of 2 positions shown; findings below may reference images not displayed]

FINDINGS: Two C-arm fluoroscopic images were obtained intraoperatively and
submitted for post operative interpretation. Mild lucency of the
medial aspect of the talar dome, compatible with osteochondral
lesion seen on prior CT. It is unclear if bone graft material has
been placed on these images or whether these images were taken
before placement. Please see the performing provider's procedural
report for further detail.
IMPRESSION: Intraoperative fluoroscopy.

## 2021-06-01 DIAGNOSIS — Z419 Encounter for procedure for purposes other than remedying health state, unspecified: Secondary | ICD-10-CM | POA: Diagnosis not present

## 2021-07-02 DIAGNOSIS — Z419 Encounter for procedure for purposes other than remedying health state, unspecified: Secondary | ICD-10-CM | POA: Diagnosis not present

## 2021-07-18 DIAGNOSIS — R051 Acute cough: Secondary | ICD-10-CM | POA: Diagnosis not present

## 2021-07-18 DIAGNOSIS — J020 Streptococcal pharyngitis: Secondary | ICD-10-CM | POA: Diagnosis not present

## 2021-08-02 DIAGNOSIS — Z419 Encounter for procedure for purposes other than remedying health state, unspecified: Secondary | ICD-10-CM | POA: Diagnosis not present

## 2021-08-24 ENCOUNTER — Ambulatory Visit: Payer: Medicaid Other | Admitting: Podiatry

## 2021-08-30 DIAGNOSIS — Z419 Encounter for procedure for purposes other than remedying health state, unspecified: Secondary | ICD-10-CM | POA: Diagnosis not present

## 2021-09-26 ENCOUNTER — Ambulatory Visit (INDEPENDENT_AMBULATORY_CARE_PROVIDER_SITE_OTHER): Payer: Medicaid Other

## 2021-09-26 ENCOUNTER — Ambulatory Visit (INDEPENDENT_AMBULATORY_CARE_PROVIDER_SITE_OTHER): Payer: Medicaid Other | Admitting: Podiatry

## 2021-09-26 ENCOUNTER — Other Ambulatory Visit: Payer: Self-pay

## 2021-09-26 DIAGNOSIS — M899 Disorder of bone, unspecified: Secondary | ICD-10-CM | POA: Diagnosis not present

## 2021-09-26 DIAGNOSIS — M216X1 Other acquired deformities of right foot: Secondary | ICD-10-CM | POA: Diagnosis not present

## 2021-09-26 DIAGNOSIS — M949 Disorder of cartilage, unspecified: Secondary | ICD-10-CM

## 2021-09-29 ENCOUNTER — Encounter: Payer: Self-pay | Admitting: Podiatry

## 2021-09-29 NOTE — Progress Notes (Signed)
?  Subjective:  ?Patient ID: Stein Windhorst, male    DOB: 2001-04-05,  MRN: [id number] ? ?Chief Complaint  ?Patient presents with  ? Ankle Pain  ?  Follow up right  ? ? ?21 y.o. male presents with the above complaint. History confirmed with patient.  Overall is doing well he estimates at about 75 to 80% better than it was before surgery.Marland Kitchen  He still notices swelling and some discomfort at the end of the day. ? ?Objective:  ?Physical Exam: ?warm, good capillary refill, no trophic changes or ulcerative lesions, normal DP and PT pulses, and normal sensory exam. ? ?Right Foot: Good full smooth range of motion of the ankle joint the does not have any pain.  There is no edema currently.  No pain on palpation.  No crepitance during range of motion ? ? ?New ankle radiographs taken today show some lucency in the medial talar dome but no increase in size or alignment since last radiographs taken ?Assessment:  ? ?1. Osteochondral talar dome lesion   ? ? ? ?Plan:  ?Patient was evaluated and treated and all questions answered. ? ?Overall he is doing fairly well.  I recommended he continue his activity as tolerated when he is on his foot for extended amount of time then he may want to support with an ankle brace lace up style such as an ASO.  I will see him back 2 years after his surgery for new radiographs. ? ?Return in about 11 months (around 08/31/2022) for follow up from ankle surgery, take new xrays .  ? ? ?

## 2021-09-30 DIAGNOSIS — Z419 Encounter for procedure for purposes other than remedying health state, unspecified: Secondary | ICD-10-CM | POA: Diagnosis not present

## 2021-10-30 DIAGNOSIS — Z419 Encounter for procedure for purposes other than remedying health state, unspecified: Secondary | ICD-10-CM | POA: Diagnosis not present

## 2021-11-30 DIAGNOSIS — Z419 Encounter for procedure for purposes other than remedying health state, unspecified: Secondary | ICD-10-CM | POA: Diagnosis not present

## 2021-12-30 DIAGNOSIS — Z419 Encounter for procedure for purposes other than remedying health state, unspecified: Secondary | ICD-10-CM | POA: Diagnosis not present

## 2022-01-30 DIAGNOSIS — Z419 Encounter for procedure for purposes other than remedying health state, unspecified: Secondary | ICD-10-CM | POA: Diagnosis not present

## 2022-02-16 DIAGNOSIS — F4310 Post-traumatic stress disorder, unspecified: Secondary | ICD-10-CM | POA: Insufficient documentation

## 2022-02-16 DIAGNOSIS — F411 Generalized anxiety disorder: Secondary | ICD-10-CM | POA: Insufficient documentation

## 2022-02-16 DIAGNOSIS — F330 Major depressive disorder, recurrent, mild: Secondary | ICD-10-CM | POA: Insufficient documentation

## 2022-02-17 ENCOUNTER — Ambulatory Visit (HOSPITAL_COMMUNITY)
Admission: EM | Admit: 2022-02-17 | Discharge: 2022-02-17 | Disposition: A | Discharge status: Home / Self Care | Payer: Medicaid Other | Attending: Urology | Admitting: Urology

## 2022-02-17 DIAGNOSIS — F330 Major depressive disorder, recurrent, mild: Secondary | ICD-10-CM

## 2022-02-17 DIAGNOSIS — F4310 Post-traumatic stress disorder, unspecified: Secondary | ICD-10-CM

## 2022-02-17 DIAGNOSIS — F411 Generalized anxiety disorder: Secondary | ICD-10-CM
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
## 2022-02-17 NOTE — Discharge Instructions (Addendum)
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
## 2022-03-02 DIAGNOSIS — Z419 Encounter for procedure for purposes other than remedying health state, unspecified: Secondary | ICD-10-CM | POA: Diagnosis not present

## 2022-04-01 DIAGNOSIS — Z419 Encounter for procedure for purposes other than remedying health state, unspecified: Secondary | ICD-10-CM | POA: Diagnosis not present

## 2022-05-02 DIAGNOSIS — Z419 Encounter for procedure for purposes other than remedying health state, unspecified: Secondary | ICD-10-CM | POA: Diagnosis not present

## 2022-06-01 DIAGNOSIS — Z419 Encounter for procedure for purposes other than remedying health state, unspecified: Secondary | ICD-10-CM | POA: Diagnosis not present

## 2022-06-21 NOTE — Telephone Encounter (Signed)
Encounter created in error

## 2022-07-02 DIAGNOSIS — Z419 Encounter for procedure for purposes other than remedying health state, unspecified: Secondary | ICD-10-CM | POA: Diagnosis not present

## 2022-08-02 DIAGNOSIS — Z419 Encounter for procedure for purposes other than remedying health state, unspecified: Secondary | ICD-10-CM | POA: Diagnosis not present

## 2022-08-08 ENCOUNTER — Ambulatory Visit (INDEPENDENT_AMBULATORY_CARE_PROVIDER_SITE_OTHER): Payer: Medicaid Other | Admitting: Podiatry

## 2022-08-08 ENCOUNTER — Ambulatory Visit (INDEPENDENT_AMBULATORY_CARE_PROVIDER_SITE_OTHER): Payer: Medicaid Other

## 2022-08-08 ENCOUNTER — Encounter: Payer: Self-pay | Admitting: Podiatry

## 2022-08-08 DIAGNOSIS — M659 Synovitis and tenosynovitis, unspecified: Secondary | ICD-10-CM | POA: Diagnosis not present

## 2022-08-08 DIAGNOSIS — M949 Disorder of cartilage, unspecified: Secondary | ICD-10-CM

## 2022-08-08 DIAGNOSIS — M899 Disorder of bone, unspecified: Secondary | ICD-10-CM

## 2022-08-08 DIAGNOSIS — S86311A Strain of muscle(s) and tendon(s) of peroneal muscle group at lower leg level, right leg, initial encounter: Secondary | ICD-10-CM

## 2022-08-08 DIAGNOSIS — M25371 Other instability, right ankle: Secondary | ICD-10-CM

## 2022-08-08 NOTE — Patient Instructions (Signed)
Call [facility] to schedule your MRI at the below locations.  Please allow at least 1 business day after your visit to process the referral.  It may take longer depending on approval from insurance.  Please let me know if you have issues or problems scheduling the MRI   [facility] [phone] [address] [place], [place] [address]  [facility] [phone] [address] , [place] [address]  

## 2022-08-12 ENCOUNTER — Encounter: Payer: Self-pay | Admitting: Podiatry

## 2022-08-12 NOTE — Progress Notes (Signed)
  Subjective:  Patient ID: Christopher Mccann, male    DOB: 2001-06-16,  MRN: [id number]  Chief Complaint  Patient presents with   Ankle Pain    follow up on right foot pain/ discuss possible surgery    22 y.o. male presents with the above complaint. History confirmed with patient.  States he is still having some pain and feeling of fatigue at the end of the day.  He has pain over the outside of the ankle.  Does not have the same type of pain he was having prior to surgery.  Overall improved but still have some deficits  Objective:  Physical Exam: warm, good capillary refill, no trophic changes or ulcerative lesions, normal DP and PT pulses, and normal sensory exam.  Right Foot: Good full smooth range of motion of the ankle joint the does not have any pain medially.  No crepitance during range of motion.  He does have some tenderness over the ATFL and CFL and peroneal tendons and with resisted eversion.  Some laxity in anterior drawer but he does appear to have some guarding on examination   New ankle radiographs taken today show good congruency of the ankle joint without significant degenerative changes Assessment:   1. Osteochondral talar dome lesion   2. Synovitis of right ankle   3. Ankle instability, right   4. Tear of peroneal tendon, right, initial encounter      Plan:  Patient was evaluated and treated and all questions answered.  Having some lateral ankle pain and feeling of giving out.  Unable to fully assess the ligaments clinically due to guarding from discomfort.  He did not have a clinical anterior drawer but I am suspicious she still has insufficiency of the lateral ankle ligament complex.  He does have some pain on the peroneals as well.  I recommended evaluating this with a new updated MRI.  Thankfully his cartilaginous lesion appears to be continuing to heal well and functioning well for him.  I have ordered a new MRI and we will discuss further treatment options once this  is complete.  Return for after MRI to review.

## 2022-08-18 ENCOUNTER — Other Ambulatory Visit: Payer: Medicaid Other

## 2022-08-31 DIAGNOSIS — Z419 Encounter for procedure for purposes other than remedying health state, unspecified: Secondary | ICD-10-CM | POA: Diagnosis not present

## 2022-09-02 DIAGNOSIS — J019 Acute sinusitis, unspecified: Secondary | ICD-10-CM | POA: Diagnosis not present

## 2022-09-02 DIAGNOSIS — R040 Epistaxis: Secondary | ICD-10-CM | POA: Diagnosis not present

## 2022-09-02 DIAGNOSIS — J209 Acute bronchitis, unspecified: Secondary | ICD-10-CM | POA: Diagnosis not present

## 2022-09-02 DIAGNOSIS — R059 Cough, unspecified: Secondary | ICD-10-CM | POA: Diagnosis not present

## 2022-10-11 DIAGNOSIS — L03116 Cellulitis of left lower limb: Secondary | ICD-10-CM | POA: Diagnosis not present

## 2022-12-01 DIAGNOSIS — Z419 Encounter for procedure for purposes other than remedying health state, unspecified: Secondary | ICD-10-CM | POA: Diagnosis not present

## 2022-12-31 DIAGNOSIS — Z419 Encounter for procedure for purposes other than remedying health state, unspecified: Secondary | ICD-10-CM | POA: Diagnosis not present

## 2023-01-27 ENCOUNTER — Emergency Department (HOSPITAL_COMMUNITY): Payer: Medicaid Other

## 2023-01-27 ENCOUNTER — Other Ambulatory Visit: Payer: Self-pay

## 2023-01-27 ENCOUNTER — Emergency Department (HOSPITAL_COMMUNITY)
Admission: EM | Admit: 2023-01-27 | Discharge: 2023-01-27 | Disposition: A | Discharge status: Home / Self Care | Payer: Medicaid Other | Attending: Emergency Medicine | Admitting: Emergency Medicine

## 2023-01-27 ENCOUNTER — Encounter (HOSPITAL_COMMUNITY): Payer: Self-pay

## 2023-01-27 DIAGNOSIS — W25XXXA Contact with sharp glass, initial encounter: Secondary | ICD-10-CM | POA: Insufficient documentation

## 2023-01-27 DIAGNOSIS — Z23 Encounter for immunization: Secondary | ICD-10-CM | POA: Diagnosis not present

## 2023-01-27 DIAGNOSIS — S81812A Laceration without foreign body, left lower leg, initial encounter: Secondary | ICD-10-CM | POA: Diagnosis not present

## 2023-01-27 DIAGNOSIS — S8992XA Unspecified injury of left lower leg, initial encounter: Secondary | ICD-10-CM | POA: Diagnosis present

## 2023-01-27 MED ORDER — LIDOCAINE-EPINEPHRINE (PF) 2 %-1:200000 IJ SOLN
10.0000 mL | Freq: Once | INTRAMUSCULAR | Status: AC
  Administered 2023-01-27: 10 mL
  Filled 2023-01-27: qty 20

## 2023-01-27 MED ORDER — TETANUS-DIPHTH-ACELL PERTUSSIS 5-2.5-18.5 LF-MCG/0.5 IM SUSY
0.5000 mL | PREFILLED_SYRINGE | Freq: Once | INTRAMUSCULAR | Status: AC
  Administered 2023-01-27: 0.5 mL via INTRAMUSCULAR
  Filled 2023-01-27: qty 0.5

## 2023-01-27 MED ORDER — IBUPROFEN 400 MG PO TABS
600.0000 mg | ORAL_TABLET | Freq: Once | ORAL | Status: AC
  Administered 2023-01-27: 600 mg via ORAL
  Filled 2023-01-27: qty 1

## 2023-01-27 NOTE — ED Triage Notes (Addendum)
Pt reports he slipped on glass on the floor today around 1600 and has small lacerations to left leg. He states there is some fragments of glass inside the lacerations. Scant bleeding. Last tetanus unknown.

## 2023-01-27 NOTE — ED Provider Triage Note (Signed)
Emergency Medicine Provider Triage Evaluation Note  Christopher Mccann , a 22 y.o. male  was evaluated in triage.  Pt complains of patient fell on broken glass.  He feels that he has retained glass in his left lower extremity.  Multiple small lacerations.  Unsure of last tetanus vaccination.  Review of Systems  Positive: Lacerations Negative: Active bleeding  Physical Exam  There were no vitals taken for this visit. Gen:   Awake, no distress   Resp:  Normal effort  MSK:   Moves extremities without difficulty  Other:    Medical Decision Making  Medically screening exam initiated at 5:07 PM.  Appropriate orders placed.  Aundray Batz was informed that the remainder of the evaluation will be completed by another provider, this initial triage assessment does not replace that evaluation, and the importance of remaining in the ED until their evaluation is complete.     Arthor Captain, PA-C 01/27/23 [phone]

## 2023-01-27 NOTE — Discharge Instructions (Addendum)
It was a pleasure taking part in your care today.  As we discussed, your workup was reassuring.  We repaired your laceration with 1 suture.  I would like for you to keep the bandage on until tomorrow morning.  After this, you can leave the wound open to air.  Please do not submerge your leg in water or pools, baths.  Please only take showers and allow water to run over it.  Please return to the ED, urgent care or your PCP in 7 to 10 days for suture removal.  Please return to the ED if there are any localized signs of infection to your wound such as redness, drainage of pus.  Please read the attached guide concerning sutured laceration care for further information.  You may take ibuprofen for pain.

## 2023-01-27 NOTE — ED Provider Notes (Signed)
[place] EMERGENCY DEPARTMENT AT [facility] Provider Note   CSN: [id number] Arrival date & time: 01/27/23  1657     History  Chief Complaint  Patient presents with   Extremity Laceration    Christopher Mccann is a 22 y.o. male with medical history of depression.  The patient presents to the ED for evaluation of laceration to left lower extremity.  Patient reports that prior to arrival he had gotten a hot casserole dish out of the oven.  He reports that the [place] dish was hot causing him to drop it.  He states that it shattered on the floor and sent "shrapnel" into his leg.  He does have a 1 cm laceration to his anterior shin on the left lower extremity.  He is unsure of his last tetanus update.  He denies medications prior to arrival.  Denies any other concerns.  HPI     Home Medications Prior to Admission medications   Medication Sig Start Date End Date Taking? Authorizing Provider  gabapentin (NEURONTIN) 300 MG capsule Take 1 capsule (300 mg total) by mouth 3 (three) times daily for 7 days. Patient not taking: Reported on 10/13/2020 08/30/20 09/06/20  Edwin Cap, DPM  oxyCODONE (OXY IR/ROXICODONE) 5 MG immediate release tablet 1-2 tablets every 4-6 hours as needed for severe pain Patient not taking: Reported on 10/13/2020 08/30/20   Edwin Cap, DPM      Allergies    Zoloft [sertraline hcl]    Review of Systems   Review of Systems  Skin:  Positive for wound.  All other systems reviewed and are negative.   Physical Exam Updated Vital Signs BP (!) 141/86 (BP Location: Right Arm)   Pulse (!) 116   Temp 98.6 F (37 C) (Oral)   Resp 17   Ht 5\' 10"  (1.778 m)   Wt 131.5 kg   SpO2 95%   BMI 41.61 kg/m  Physical Exam Vitals and nursing note reviewed.  Constitutional:      General: He is not in acute distress.    Appearance: Normal appearance. He is not ill-appearing, toxic-appearing or diaphoretic.  HENT:     Head: Normocephalic and atraumatic.      Mouth/Throat:     Mouth: Mucous membranes are moist.     Pharynx: Oropharynx is clear.  Eyes:     Extraocular Movements: Extraocular movements intact.     Conjunctiva/sclera: Conjunctivae normal.     Pupils: Pupils are equal, round, and reactive to light.  Cardiovascular:     Rate and Rhythm: Normal rate and regular rhythm.  Pulmonary:     Effort: Pulmonary effort is normal.     Breath sounds: No wheezing.  Abdominal:     General: Abdomen is flat. Bowel sounds are normal.     Palpations: Abdomen is soft.     Tenderness: There is no abdominal tenderness.  Musculoskeletal:     Cervical back: Normal range of motion and neck supple. No tenderness.  Skin:    General: Skin is warm and dry.     Capillary Refill: Capillary refill takes less than 2 seconds.     Comments: 1 cm laceration to anterior left shin.  Bleeding controlled.  Neurological:     Mental Status: He is alert and oriented to person, place, and time.     ED Results / Procedures / Treatments   Labs (all labs ordered are listed, but only abnormal results are displayed) Labs Reviewed - No data to display  EKG None  Radiology DG Tibia/Fibula Left  Result Date: 01/27/2023 CLINICAL DATA:  Lacerations of left lower leg after fall. EXAM: LEFT TIBIA AND FIBULA - 2 VIEW COMPARISON:  None Available. FINDINGS: There is no evidence of fracture or other focal bone lesions. Soft tissues are unremarkable. No radiopaque foreign bodies are noted. IMPRESSION: Negative. Electronically Signed   By: Lupita Raider M.D.   On: 01/27/2023 17:51    Procedures .Marland KitchenLaceration Repair  Date/Time: 01/27/2023 10:16 PM  Performed by: Al Decant, PA-C Authorized by: Al Decant, PA-C   Consent:    Consent obtained:  Verbal   Consent given by:  Patient   Risks, benefits, and alternatives were discussed: yes     Risks discussed:  Infection, need for additional repair, nerve damage, poor wound healing, poor cosmetic result,  pain, retained foreign body, tendon damage and vascular damage   Alternatives discussed:  No treatment Universal protocol:    Patient identity confirmed:  Verbally with patient Anesthesia:    Anesthesia method:  Local infiltration   Local anesthetic:  Lidocaine 2% WITH epi Laceration details:    Location:  Leg   Leg location:  L lower leg   Length (cm):  1 Pre-procedure details:    Preparation:  Patient was prepped and draped in usual sterile fashion and imaging obtained to evaluate for foreign bodies Treatment:    Area cleansed with:  Soap and water   Amount of cleaning:  Standard   Irrigation solution:  Sterile water   Debridement:  None Skin repair:    Repair method:  Sutures   Suture size:  3-0   Suture material:  Prolene   Suture technique:  Simple interrupted   Number of sutures:  1 Approximation:    Approximation:  Close Repair type:    Repair type:  Simple Post-procedure details:    Dressing:  Non-adherent dressing   Procedure completion:  Tolerated well, no immediate complications    Medications Ordered in ED Medications  Tdap (BOOSTRIX) injection 0.5 mL (0.5 mLs Intramuscular Given 01/27/23 2056)  lidocaine-EPINEPHrine (XYLOCAINE W/EPI) 2 %-1:200000 (PF) injection 10 mL (10 mLs Other Given by Other 01/27/23 2141)  ibuprofen (ADVIL) tablet 600 mg (600 mg Oral Given 01/27/23 2122)    ED Course/ Medical Decision Making/ A&P  Medical Decision Making Risk Prescription drug management.   22 year old no presents to ED for evaluation.  Please see HPI for further details.  Patient has 1 cm laceration to left lower extremity.  This was repaired per procedure note.  Patient had tetanus updated as well.  Patient will return in 7 to 10 days for suture removal.  He was given return precautions and he voiced understanding.  He is discharged in stable condition.   Final Clinical Impression(s) / ED Diagnoses Final diagnoses:  Laceration of left lower extremity, initial  encounter    Rx / DC Orders ED Discharge Orders     None         Clent Ridges 01/27/23 2217    Pricilla Loveless, MD 01/28/23 1627

## 2023-01-31 DIAGNOSIS — Z419 Encounter for procedure for purposes other than remedying health state, unspecified: Secondary | ICD-10-CM | POA: Diagnosis not present

## 2023-03-03 DIAGNOSIS — Z419 Encounter for procedure for purposes other than remedying health state, unspecified: Secondary | ICD-10-CM | POA: Diagnosis not present

## 2023-04-02 DIAGNOSIS — Z419 Encounter for procedure for purposes other than remedying health state, unspecified: Secondary | ICD-10-CM | POA: Diagnosis not present

## 2023-05-03 DIAGNOSIS — Z419 Encounter for procedure for purposes other than remedying health state, unspecified: Secondary | ICD-10-CM | POA: Diagnosis not present

## 2023-06-02 DIAGNOSIS — Z419 Encounter for procedure for purposes other than remedying health state, unspecified: Secondary | ICD-10-CM | POA: Diagnosis not present

## 2023-07-03 DIAGNOSIS — Z419 Encounter for procedure for purposes other than remedying health state, unspecified: Secondary | ICD-10-CM | POA: Diagnosis not present

## 2023-08-03 DIAGNOSIS — Z419 Encounter for procedure for purposes other than remedying health state, unspecified: Secondary | ICD-10-CM | POA: Diagnosis not present

## 2023-08-31 DIAGNOSIS — Z419 Encounter for procedure for purposes other than remedying health state, unspecified: Secondary | ICD-10-CM | POA: Diagnosis not present

## 2023-10-12 DIAGNOSIS — Z419 Encounter for procedure for purposes other than remedying health state, unspecified: Secondary | ICD-10-CM | POA: Diagnosis not present

## 2023-10-25 ENCOUNTER — Other Ambulatory Visit: Payer: Self-pay

## 2023-10-25 ENCOUNTER — Encounter: Payer: Self-pay | Admitting: Emergency Medicine

## 2023-10-25 ENCOUNTER — Emergency Department

## 2023-10-25 ENCOUNTER — Emergency Department
Admission: EM | Admit: 2023-10-25 | Discharge: 2023-10-25 | Disposition: A | Discharge status: Home / Self Care | Attending: Emergency Medicine | Admitting: Emergency Medicine

## 2023-10-25 DIAGNOSIS — M79671 Pain in right foot: Secondary | ICD-10-CM | POA: Diagnosis not present

## 2023-10-25 DIAGNOSIS — M722 Plantar fascial fibromatosis: Secondary | ICD-10-CM | POA: Insufficient documentation
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
## 2023-10-25 NOTE — ED Provider Notes (Signed)
   [facility] Provider Note    Event Date/Time   First MD Initiated Contact with Patient 10/25/23 2220     (approximate)   History   Foot Pain   HPI  Christopher Mccann is a 23 y.o. male who presents with complaints of pain to the bottom of the right foot, he feels like he is stepping on a ball.  No injury     Physical Exam   Triage Vital Signs: ED Triage Vitals  Encounter Vitals Group     BP 10/25/23 2154 (!) 157/94     Systolic BP Percentile --      Diastolic BP Percentile --      Pulse Rate 10/25/23 2154 (!) 107     Resp 10/25/23 2154 18     Temp 10/25/23 2154 98.3 F (36.8 C)     Temp Source 10/25/23 2154 Oral     SpO2 10/25/23 2154 99 %     Weight 10/25/23 2155 131.5 kg (289 lb 14.5 oz)     Height --      Head Circumference --      Peak Flow --      Pain Score 10/25/23 2154 7     Pain Loc --      Pain Education --      Exclude from Growth Chart --     Most recent vital signs: Vitals:   10/25/23 2154  BP: (!) 157/94  Pulse: (!) 107  Resp: 18  Temp: 98.3 F (36.8 C)  SpO2: 99%     General: Awake, no distress.  CV:  Good peripheral perfusion.  Resp:  Normal effort.  Abd:  No distention.  Other:  , mild tenderness along the plantar fascia, otherwise unremarkable   ED Results / Procedures / Treatments   Labs (all labs ordered are listed, but only abnormal results are displayed) Labs Reviewed - No data to display   EKG     RADIOLOGY Foot x-ray viewed interpret by me, no acute abnormality    PROCEDURES:  Critical Care performed:   Procedures   MEDICATIONS ORDERED IN ED: Medications - No data to display   IMPRESSION / MDM / ASSESSMENT AND PLAN / ED COURSE  I reviewed the triage vital signs and the nursing notes. Patient's presentation is most consistent with acute complicated illness / injury requiring diagnostic workup.  Patient with foot pain as detailed above, differential includes plantar fasciitis, he  reports a history of a foot surgery in the past and was told that he may have to have "a bone removed "at some point  X-ray is reassuring, no bony normalities, exam is most consistent with plantar fascia, conservative care outpatient follow-up as needed        FINAL CLINICAL IMPRESSION(S) / ED DIAGNOSES   Final diagnoses:  Plantar fasciitis of right foot     Rx / DC Orders   ED Discharge Orders     None        Note:  This document was prepared using Dragon voice recognition software and may include unintentional dictation errors.   Bryson Carbine, MD 10/25/23 2239

## 2023-11-11 DIAGNOSIS — Z419 Encounter for procedure for purposes other than remedying health state, unspecified: Secondary | ICD-10-CM | POA: Diagnosis not present

## 2023-12-12 DIAGNOSIS — Z419 Encounter for procedure for purposes other than remedying health state, unspecified: Secondary | ICD-10-CM | POA: Diagnosis not present

## 2024-01-11 DIAGNOSIS — Z419 Encounter for procedure for purposes other than remedying health state, unspecified: Secondary | ICD-10-CM | POA: Diagnosis not present

## 2024-02-03 ENCOUNTER — Encounter (HOSPITAL_COMMUNITY): Payer: Self-pay

## 2024-02-03 ENCOUNTER — Other Ambulatory Visit: Payer: Self-pay

## 2024-02-03 ENCOUNTER — Emergency Department (HOSPITAL_COMMUNITY)
Admission: EM | Admit: 2024-02-03 | Discharge: 2024-02-04 | Disposition: A | Discharge status: Home / Self Care | Attending: Emergency Medicine | Admitting: Emergency Medicine

## 2024-02-03 DIAGNOSIS — R197 Diarrhea, unspecified: Secondary | ICD-10-CM | POA: Diagnosis not present

## 2024-02-03 DIAGNOSIS — R112 Nausea with vomiting, unspecified: Secondary | ICD-10-CM | POA: Insufficient documentation

## 2024-02-03 LAB — CBC
HCT: 45.4 % (ref 39.0–52.0)
Hemoglobin: 15 g/dL (ref 13.0–17.0)
MCH: 27.4 pg (ref 26.0–34.0)
MCHC: 33 g/dL (ref 30.0–36.0)
MCV: 83 fL (ref 80.0–100.0)
Platelets: 411 K/uL — ABNORMAL HIGH (ref 150–400)
RBC: 5.47 MIL/uL (ref 4.22–5.81)
RDW: 13.8 % (ref 11.5–15.5)
WBC: 11.8 K/uL — ABNORMAL HIGH (ref 4.0–10.5)
nRBC: 0 % (ref 0.0–0.2)

## 2024-02-03 LAB — URINALYSIS, ROUTINE W REFLEX MICROSCOPIC
Bilirubin Urine: NEGATIVE
Glucose, UA: NEGATIVE mg/dL
Hgb urine dipstick: NEGATIVE
Ketones, ur: NEGATIVE mg/dL
Leukocytes,Ua: NEGATIVE
Nitrite: NEGATIVE
Protein, ur: NEGATIVE mg/dL
Specific Gravity, Urine: 1.028 (ref 1.005–1.030)
pH: 5 (ref 5.0–8.0)

## 2024-02-03 LAB — COMPREHENSIVE METABOLIC PANEL WITH GFR
ALT: 36 U/L (ref 0–44)
AST: 26 U/L (ref 15–41)
Albumin: 3.4 g/dL — ABNORMAL LOW (ref 3.5–5.0)
Alkaline Phosphatase: 50 U/L (ref 38–126)
Anion gap: 10 (ref 5–15)
BUN: 13 mg/dL (ref 6–20)
CO2: 22 mmol/L (ref 22–32)
Calcium: 8.7 mg/dL — ABNORMAL LOW (ref 8.9–10.3)
Chloride: 106 mmol/L (ref 98–111)
Creatinine, Ser: 0.8 mg/dL (ref 0.61–1.24)
GFR, Estimated: >60 mL/min (ref >60)
Glucose, Bld: 115 mg/dL — ABNORMAL HIGH (ref 70–99)
Potassium: 3.6 mmol/L (ref 3.5–5.1)
Sodium: 138 mmol/L (ref 135–145)
Total Bilirubin: 0.8 mg/dL (ref 0.0–1.2)
Total Protein: 6.8 g/dL (ref 6.5–8.1)

## 2024-02-03 LAB — LIPASE, BLOOD: Lipase: 28 U/L (ref 11–51)
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
## 2024-02-04 MED ORDER — ONDANSETRON 4 MG PO TBDP: 4.0000 mg | ORAL_TABLET | Freq: Three times a day (TID) | ORAL | 0 refills | Status: AC | PRN

## 2024-02-04 NOTE — ED Provider Notes (Signed)
 [facility] Emergency Department Provider Note MRN:  [id number]  Arrival date & time: 02/04/24     Chief Complaint   Abdominal Pain   History of Present Illness   Christopher Mccann is a 23 y.o. year-old male presents to the ED with chief complaint of nausea, vomiting, and diarrhea.  States that symptoms started today while at work.  He associates his symptoms with having eaten cookout yesterday.  He states that he continue to work, but was sent home and told to go in to be seen and get a note for work.  He states that he is feeling a little bit better now, but still has some nausea.  States that he took some nausea medicine that his mother gave him.  He denies any focal abdominal pain.  Denies any dysuria.  Denies fevers or chills.  States that he primarily just needs a note for work..  History provided by patient.   Review of Systems  Pertinent positive and negative review of systems noted in HPI.    Physical Exam   Vitals:   02/03/24 2240  BP: (!) 143/92  Pulse: 97  Resp: 20  Temp: 98.2 F (36.8 C)  SpO2: 99%    CONSTITUTIONAL:  non toxic-appearing, NAD NEURO:  Alert and oriented x 3, CN 3-12 grossly intact EYES:  eyes equal and reactive ENT/NECK:  Supple, no stridor  CARDIO:  normal rate, regular rhythm, appears well-perfused  PULM:  No respiratory distress, CTAB GI/GU:  non-distended, no focal abdominal tenderness MSK/SPINE:  No gross deformities, no edema, moves all extremities  SKIN:  no rash, atraumatic   *Additional and/or pertinent findings included in MDM below  Diagnostic and Interventional Summary    EKG Interpretation Date/Time:    Ventricular Rate:    PR Interval:    QRS Duration:    QT Interval:    QTC Calculation:   R Axis:      Text Interpretation:         Labs Reviewed  COMPREHENSIVE METABOLIC PANEL WITH GFR - Abnormal; Notable for the following components:      Result Value   Glucose, Bld 115 (*)    Calcium 8.7 (*)     Albumin 3.4 (*)    All other components within normal limits  CBC - Abnormal; Notable for the following components:   WBC 11.8 (*)    Platelets 411 (*)    All other components within normal limits  LIPASE, BLOOD  URINALYSIS, ROUTINE W REFLEX MICROSCOPIC    No orders to display    Medications - No data to display   Procedures  /  Critical Care Procedures  ED Course and Medical Decision Making  I have reviewed the triage vital signs, the nursing notes, and pertinent available records from the EMR.  Social Determinants Affecting Complexity of Care: Patient has no clinically significant social determinants affecting this chief complaint..   ED Course:    Medical Decision Making Patient here with nausea, vomiting, and diarrhea.  States that his symptoms started suddenly yesterday.  States that he ate cookout the night before, and thinks that his symptoms are associated with that.  He denies known sick contacts.  He is afebrile.  He does not have any focal abdominal tenderness on exam.  His labs are notable for a mild nonspecific, but likely reactive leukocytosis.  I doubt appendicitis.  Doubt gallbladder disease.  Urine looks good, doubt UTI or kidney stone.  I offered the patient treatment with  IV fluid and IV antiemetics.  Patient declined, and states that he really just needs a note for work.  Will discharge him home with prescription for Zofran  and a work note.  He can return to work in 2 days.  Amount and/or Complexity of Data Reviewed Labs: ordered.  Risk Prescription drug management.         Consultants: No consultations were needed in caring for this patient.   Treatment and Plan: Emergency department workup does not suggest an emergent condition requiring admission or immediate intervention beyond  what has been performed at this time. The patient is safe for discharge and has  been instructed to return immediately for worsening symptoms, change in  symptoms or  any other concerns    Final Clinical Impressions(s) / ED Diagnoses     ICD-10-CM   1. Nausea vomiting and diarrhea  R11.2    R19.7       ED Discharge Orders          Ordered    ondansetron  (ZOFRAN -ODT) 4 MG disintegrating tablet  Every 8 hours PRN        02/04/24 0120              Discharge Instructions Discussed with and Provided to Patient:   Discharge Instructions   None      Vicky Charleston, PA-C 02/04/24 0126    Griselda Norris, MD 02/04/24 [phone]

## 2024-02-11 DIAGNOSIS — Z419 Encounter for procedure for purposes other than remedying health state, unspecified: Secondary | ICD-10-CM | POA: Diagnosis not present

## 2024-02-12 DIAGNOSIS — B3749 Other urogenital candidiasis: Secondary | ICD-10-CM | POA: Diagnosis not present

## 2024-02-12 DIAGNOSIS — R21 Rash and other nonspecific skin eruption: Secondary | ICD-10-CM | POA: Diagnosis not present

## 2024-02-24 ENCOUNTER — Ambulatory Visit: Admitting: Podiatry

## 2024-03-12 ENCOUNTER — Ambulatory Visit: Admitting: Podiatry

## 2024-03-13 DIAGNOSIS — Z419 Encounter for procedure for purposes other than remedying health state, unspecified: Secondary | ICD-10-CM | POA: Diagnosis not present

## 2024-05-13 DIAGNOSIS — Z419 Encounter for procedure for purposes other than remedying health state, unspecified: Secondary | ICD-10-CM | POA: Diagnosis not present
